# Patient Record
Sex: Female | Born: 1955 | Race: White | Hispanic: No | Smoking: Former smoker
Health system: Southern US, Community
[De-identification: ages and names within clinical notes are randomized; demographics above are authoritative.]

## PROBLEM LIST (undated history)

## (undated) DIAGNOSIS — C801 Malignant (primary) neoplasm, unspecified: Secondary | ICD-10-CM

## (undated) DIAGNOSIS — M1990 Unspecified osteoarthritis, unspecified site: Secondary | ICD-10-CM

## (undated) DIAGNOSIS — D490 Neoplasm of unspecified behavior of digestive system: Secondary | ICD-10-CM

## (undated) DIAGNOSIS — D039 Melanoma in situ, unspecified: Secondary | ICD-10-CM

## (undated) DIAGNOSIS — R002 Palpitations: Secondary | ICD-10-CM

## (undated) DIAGNOSIS — T7840XA Allergy, unspecified, initial encounter: Secondary | ICD-10-CM

## (undated) HISTORY — DX: Melanoma in situ, unspecified: D03.9

## (undated) HISTORY — PX: MELANOMA EXCISION: SHX5266

## (undated) HISTORY — DX: Malignant (primary) neoplasm, unspecified: C80.1

## (undated) HISTORY — PX: OTHER SURGICAL HISTORY: SHX169

## (undated) HISTORY — DX: Palpitations: R00.2

## (undated) HISTORY — DX: Neoplasm of unspecified behavior of digestive system: D49.0

## (undated) HISTORY — DX: Allergy, unspecified, initial encounter: T78.40XA

## (undated) HISTORY — PX: CARPAL TUNNEL RELEASE: SHX101

## (undated) HISTORY — DX: Unspecified osteoarthritis, unspecified site: M19.90

---

## 2001-04-01 ENCOUNTER — Other Ambulatory Visit: Admission: RE | Admit: 2001-04-01 | Discharge: 2001-04-01 | Payer: Self-pay | Admitting: Obstetrics and Gynecology

## 2002-04-01 ENCOUNTER — Ambulatory Visit (HOSPITAL_COMMUNITY): Admission: RE | Admit: 2002-04-01 | Discharge: 2002-04-01 | Payer: Self-pay | Admitting: Obstetrics & Gynecology

## 2002-04-01 ENCOUNTER — Encounter: Payer: Self-pay | Admitting: Obstetrics & Gynecology

## 2002-10-07 ENCOUNTER — Ambulatory Visit (HOSPITAL_COMMUNITY): Admission: RE | Admit: 2002-10-07 | Discharge: 2002-10-07 | Payer: Self-pay | Admitting: Obstetrics & Gynecology

## 2002-10-07 ENCOUNTER — Encounter: Payer: Self-pay | Admitting: Obstetrics & Gynecology

## 2002-10-09 DIAGNOSIS — D039 Melanoma in situ, unspecified: Secondary | ICD-10-CM

## 2002-10-09 HISTORY — DX: Melanoma in situ, unspecified: D03.9

## 2003-05-01 ENCOUNTER — Encounter: Payer: Self-pay | Admitting: Emergency Medicine

## 2003-05-01 ENCOUNTER — Emergency Department (HOSPITAL_COMMUNITY): Admission: EM | Admit: 2003-05-01 | Discharge: 2003-05-01 | Payer: Self-pay | Admitting: Emergency Medicine

## 2003-11-10 ENCOUNTER — Ambulatory Visit (HOSPITAL_COMMUNITY): Admission: RE | Admit: 2003-11-10 | Discharge: 2003-11-10 | Payer: Self-pay | Admitting: Obstetrics & Gynecology

## 2004-09-05 ENCOUNTER — Ambulatory Visit (HOSPITAL_COMMUNITY): Admission: RE | Admit: 2004-09-05 | Discharge: 2004-09-05 | Payer: Self-pay | Admitting: Family Medicine

## 2005-01-12 ENCOUNTER — Ambulatory Visit: Payer: Self-pay | Admitting: Orthopedic Surgery

## 2006-07-30 ENCOUNTER — Ambulatory Visit (HOSPITAL_COMMUNITY): Admission: RE | Admit: 2006-07-30 | Discharge: 2006-07-30 | Payer: Self-pay | Admitting: Obstetrics & Gynecology

## 2007-08-05 ENCOUNTER — Other Ambulatory Visit: Admission: RE | Admit: 2007-08-05 | Discharge: 2007-08-05 | Payer: Self-pay | Admitting: Obstetrics & Gynecology

## 2007-08-13 ENCOUNTER — Ambulatory Visit (HOSPITAL_COMMUNITY): Admission: RE | Admit: 2007-08-13 | Discharge: 2007-08-13 | Payer: Self-pay | Admitting: Obstetrics & Gynecology

## 2008-06-10 ENCOUNTER — Encounter: Payer: Self-pay | Admitting: Orthopedic Surgery

## 2008-06-10 ENCOUNTER — Ambulatory Visit (HOSPITAL_COMMUNITY): Admission: RE | Admit: 2008-06-10 | Discharge: 2008-06-10 | Payer: Self-pay | Admitting: Family Medicine

## 2008-08-10 ENCOUNTER — Ambulatory Visit: Payer: Self-pay | Admitting: Orthopedic Surgery

## 2008-08-10 DIAGNOSIS — M19079 Primary osteoarthritis, unspecified ankle and foot: Secondary | ICD-10-CM | POA: Insufficient documentation

## 2008-08-11 ENCOUNTER — Telehealth: Payer: Self-pay | Admitting: Orthopedic Surgery

## 2008-08-12 ENCOUNTER — Ambulatory Visit (HOSPITAL_COMMUNITY): Admission: RE | Admit: 2008-08-12 | Discharge: 2008-08-12 | Payer: Self-pay | Admitting: Orthopedic Surgery

## 2008-08-17 ENCOUNTER — Ambulatory Visit: Payer: Self-pay | Admitting: Orthopedic Surgery

## 2008-08-17 DIAGNOSIS — S93409A Sprain of unspecified ligament of unspecified ankle, initial encounter: Secondary | ICD-10-CM | POA: Insufficient documentation

## 2008-08-17 DIAGNOSIS — M7750 Other enthesopathy of unspecified foot: Secondary | ICD-10-CM | POA: Insufficient documentation

## 2008-08-18 ENCOUNTER — Encounter: Payer: Self-pay | Admitting: Orthopedic Surgery

## 2008-08-18 ENCOUNTER — Encounter (HOSPITAL_COMMUNITY): Admission: RE | Admit: 2008-08-18 | Discharge: 2008-09-17 | Payer: Self-pay | Admitting: Orthopedic Surgery

## 2008-08-28 ENCOUNTER — Ambulatory Visit (HOSPITAL_COMMUNITY): Admission: RE | Admit: 2008-08-28 | Discharge: 2008-08-28 | Payer: Self-pay | Admitting: Family Medicine

## 2008-09-16 ENCOUNTER — Ambulatory Visit: Payer: Self-pay | Admitting: Orthopedic Surgery

## 2008-09-21 ENCOUNTER — Ambulatory Visit: Payer: Self-pay | Admitting: Orthopedic Surgery

## 2008-09-21 DIAGNOSIS — IMO0002 Reserved for concepts with insufficient information to code with codable children: Secondary | ICD-10-CM | POA: Insufficient documentation

## 2008-09-22 ENCOUNTER — Encounter (HOSPITAL_COMMUNITY): Admission: RE | Admit: 2008-09-22 | Discharge: 2008-10-22 | Payer: Self-pay | Admitting: Orthopedic Surgery

## 2008-09-22 ENCOUNTER — Ambulatory Visit (HOSPITAL_COMMUNITY): Payer: Self-pay | Admitting: Orthopedic Surgery

## 2008-09-22 ENCOUNTER — Encounter: Payer: Self-pay | Admitting: Orthopedic Surgery

## 2008-09-22 ENCOUNTER — Encounter (HOSPITAL_COMMUNITY): Admission: RE | Admit: 2008-09-22 | Discharge: 2008-10-06 | Payer: Self-pay | Admitting: Orthopedic Surgery

## 2008-09-22 ENCOUNTER — Ambulatory Visit: Payer: Self-pay | Admitting: Orthopedic Surgery

## 2008-09-23 ENCOUNTER — Ambulatory Visit: Payer: Self-pay | Admitting: Orthopedic Surgery

## 2008-09-24 ENCOUNTER — Ambulatory Visit: Payer: Self-pay | Admitting: Orthopedic Surgery

## 2008-09-28 ENCOUNTER — Ambulatory Visit: Payer: Self-pay | Admitting: Orthopedic Surgery

## 2008-10-12 ENCOUNTER — Ambulatory Visit: Payer: Self-pay | Admitting: Orthopedic Surgery

## 2009-04-13 ENCOUNTER — Ambulatory Visit (HOSPITAL_COMMUNITY): Admission: RE | Admit: 2009-04-13 | Discharge: 2009-04-13 | Payer: Self-pay | Admitting: Obstetrics & Gynecology

## 2009-04-29 ENCOUNTER — Other Ambulatory Visit: Admission: RE | Admit: 2009-04-29 | Discharge: 2009-04-29 | Payer: Self-pay | Admitting: Obstetrics & Gynecology

## 2009-08-19 ENCOUNTER — Ambulatory Visit: Payer: Self-pay | Admitting: Orthopedic Surgery

## 2009-08-19 DIAGNOSIS — M25519 Pain in unspecified shoulder: Secondary | ICD-10-CM

## 2009-08-19 DIAGNOSIS — G5690 Unspecified mononeuropathy of unspecified upper limb: Secondary | ICD-10-CM | POA: Insufficient documentation

## 2010-07-12 ENCOUNTER — Emergency Department (HOSPITAL_COMMUNITY): Admission: EM | Admit: 2010-07-12 | Discharge: 2010-07-12 | Payer: Self-pay | Admitting: Emergency Medicine

## 2010-10-09 DIAGNOSIS — R002 Palpitations: Secondary | ICD-10-CM

## 2010-10-09 HISTORY — DX: Palpitations: R00.2

## 2011-04-11 ENCOUNTER — Ambulatory Visit (INDEPENDENT_AMBULATORY_CARE_PROVIDER_SITE_OTHER): Payer: BC Managed Care – PPO | Admitting: Orthopedic Surgery

## 2011-04-11 ENCOUNTER — Encounter: Payer: Self-pay | Admitting: Orthopedic Surgery

## 2011-04-11 VITALS — HR 70 | Ht 66.5 in | Wt 140.0 lb

## 2011-04-11 DIAGNOSIS — M19079 Primary osteoarthritis, unspecified ankle and foot: Secondary | ICD-10-CM | POA: Insufficient documentation

## 2011-04-11 NOTE — Patient Instructions (Signed)
Ankle arthritis  Aleve twice a day

## 2011-04-11 NOTE — Progress Notes (Signed)
55 year old female presents back with ankle pain  In 2009 we had an MRI which showed the following:  Tendinosis and intrasubstance partial tearing the peroneus  longus tendon. No full-thickness tear is identified.  2. Tenosynovitis of the tibialis posterior, flexor digitorum  longus and possibly flexor hallucis longus tendons. Fluid about  the FHL may be related to a tibiotalar joint effusion.  3. Marked tibiotalar osteoarthritis.  4. Small osteochondral lesion in the left lateral talar dome.  5. 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  6. Increased T2 signal about the PTFL as can be seen in posterior  impingement.   She comes in today with complaints of pain in her RIGHT ankle described as sharp and stabbing intermittent seems to be worse when she is trying to play tennis run or pivot and it is in the dorsal and lateral portion of the ankle joint.  She denies any new injury.  Pain started about 3 weeks ago.  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  Her review of systems is normal no shortness of breath heartburn frequency numbness or tingling  Vital signs are stable as recorded  General appearance is normal  The patient is alert and oriented x3  The patient's mood and affect are normal  Gait assessment: Seems to be normal with decreased dorsiflexion of the RIGHT ankle The cardiovascular exam reveals normal pulses and temperature without edema swelling.  The sensory exam is normal.  There are no pathologic reflexes.  Balance is normal.   Exam of the RIGHT ankle reveals 5 of dorsiflexion 35 plantar flexion Inspection Tenderness dorsally and at the lateral tib-fib area Range of motion Stated Stability Normal Strength  Normal Skin Normal  Repeat film showed tibial talar arthritis especially on the lateral view.  On the AP view there appears to be some shift in the talus in the mortise.  Diagnosis tibiotalar arthritis severe  Recommended change activities, take up a new sports such as golf.  Although the traditional treatment has been ankle fusion I would think that she might be a candidate for an ankle replacement in the future.  She should take some anti-inflammatories.  We decided to use Aleve twice a day since ibuprofen tends to not upper stomach.  If this doesn't work I will call her in a prescription.

## 2011-06-27 ENCOUNTER — Encounter: Payer: Self-pay | Admitting: Gastroenterology

## 2011-07-03 ENCOUNTER — Ambulatory Visit (AMBULATORY_SURGERY_CENTER): Payer: BC Managed Care – PPO | Admitting: *Deleted

## 2011-07-03 VITALS — Ht 66.0 in | Wt 145.2 lb

## 2011-07-03 DIAGNOSIS — Z1211 Encounter for screening for malignant neoplasm of colon: Secondary | ICD-10-CM

## 2011-07-03 MED ORDER — PEG-KCL-NACL-NASULF-NA ASC-C 100 G PO SOLR: 1.0000 | Freq: Once | ORAL | Status: DC

## 2011-07-04 ENCOUNTER — Encounter: Payer: Self-pay | Admitting: Gastroenterology

## 2011-07-12 ENCOUNTER — Other Ambulatory Visit: Payer: Self-pay | Admitting: Obstetrics & Gynecology

## 2011-07-12 DIAGNOSIS — Q826 Congenital sacral dimple: Secondary | ICD-10-CM

## 2011-07-12 DIAGNOSIS — Q899 Congenital malformation, unspecified: Secondary | ICD-10-CM

## 2011-07-17 ENCOUNTER — Ambulatory Visit (AMBULATORY_SURGERY_CENTER): Payer: BC Managed Care – PPO | Admitting: Gastroenterology

## 2011-07-17 ENCOUNTER — Encounter: Payer: Self-pay | Admitting: Gastroenterology

## 2011-07-17 VITALS — BP 130/84 | HR 55 | Temp 97.0°F | Resp 19 | Ht 66.0 in | Wt 145.0 lb

## 2011-07-17 DIAGNOSIS — Z1211 Encounter for screening for malignant neoplasm of colon: Secondary | ICD-10-CM

## 2011-07-17 MED ORDER — SODIUM CHLORIDE 0.9 % IV SOLN: 500.0000 mL | INTRAVENOUS | Status: DC

## 2011-07-17 NOTE — Progress Notes (Signed)
Pt was cramping while the scope was advanced to the cecum.  Once the scope was being removed, the pt relaxed ans rested comfortably.  Sleeping noted.  maw

## 2011-07-17 NOTE — Patient Instructions (Signed)
PLEASE FOLLOW DISCHARGE INSTRUCTIONS GIVEN TODAY. RESUME MEDICATIONS TODAY. NORMAL COLONOSCOPY, REPEAT THIS IN 10 YEARS. CALL us WITH ANY QUESTIONS OR CONCERNS.

## 2011-07-18 ENCOUNTER — Telehealth: Payer: Self-pay | Admitting: *Deleted

## 2011-07-18 NOTE — Telephone Encounter (Signed)
No answer, no identifier on answering machine, no message left.

## 2011-07-26 ENCOUNTER — Ambulatory Visit (HOSPITAL_COMMUNITY)
Admission: RE | Admit: 2011-07-26 | Discharge: 2011-07-26 | Disposition: A | Discharge status: Home / Self Care | Payer: BC Managed Care – PPO | Source: Ambulatory Visit | Attending: Obstetrics & Gynecology | Admitting: Obstetrics & Gynecology

## 2011-07-26 ENCOUNTER — Other Ambulatory Visit (HOSPITAL_COMMUNITY): Payer: Self-pay | Admitting: Obstetrics & Gynecology

## 2011-07-26 DIAGNOSIS — N6489 Other specified disorders of breast: Secondary | ICD-10-CM | POA: Insufficient documentation

## 2011-07-26 DIAGNOSIS — Q899 Congenital malformation, unspecified: Secondary | ICD-10-CM

## 2011-08-07 ENCOUNTER — Other Ambulatory Visit: Payer: Self-pay | Admitting: Obstetrics & Gynecology

## 2011-08-07 ENCOUNTER — Other Ambulatory Visit (HOSPITAL_COMMUNITY)
Admission: RE | Admit: 2011-08-07 | Discharge: 2011-08-07 | Disposition: A | Discharge status: Home / Self Care | Payer: BC Managed Care – PPO | Source: Ambulatory Visit | Attending: Obstetrics & Gynecology | Admitting: Obstetrics & Gynecology

## 2011-08-07 DIAGNOSIS — Z01419 Encounter for gynecological examination (general) (routine) without abnormal findings: Secondary | ICD-10-CM | POA: Insufficient documentation

## 2011-08-22 ENCOUNTER — Telehealth: Payer: Self-pay | Admitting: Obstetrics & Gynecology

## 2011-08-22 NOTE — Telephone Encounter (Signed)
Dx is palpitations and sob need to wear for 2 weeks

## 2011-08-22 NOTE — Telephone Encounter (Signed)
Dr Despina Hidden needs a event monitor ordered on patient

## 2011-08-23 ENCOUNTER — Encounter: Payer: Self-pay | Admitting: *Deleted

## 2011-09-11 DIAGNOSIS — R002 Palpitations: Secondary | ICD-10-CM

## 2011-09-22 ENCOUNTER — Other Ambulatory Visit (HOSPITAL_COMMUNITY): Payer: Self-pay | Admitting: Family Medicine

## 2011-09-22 DIAGNOSIS — R0989 Other specified symptoms and signs involving the circulatory and respiratory systems: Secondary | ICD-10-CM

## 2011-09-22 DIAGNOSIS — R002 Palpitations: Secondary | ICD-10-CM

## 2011-09-22 DIAGNOSIS — K219 Gastro-esophageal reflux disease without esophagitis: Secondary | ICD-10-CM

## 2011-09-25 ENCOUNTER — Encounter (HOSPITAL_COMMUNITY): Payer: Self-pay

## 2011-09-25 ENCOUNTER — Ambulatory Visit (HOSPITAL_COMMUNITY)
Admission: RE | Admit: 2011-09-25 | Discharge: 2011-09-25 | Disposition: A | Discharge status: Home / Self Care | Payer: BC Managed Care – PPO | Source: Ambulatory Visit | Attending: Family Medicine | Admitting: Family Medicine

## 2011-09-25 DIAGNOSIS — R0602 Shortness of breath: Secondary | ICD-10-CM | POA: Insufficient documentation

## 2011-09-25 DIAGNOSIS — R002 Palpitations: Secondary | ICD-10-CM

## 2011-09-25 DIAGNOSIS — K219 Gastro-esophageal reflux disease without esophagitis: Secondary | ICD-10-CM

## 2011-09-25 DIAGNOSIS — R0989 Other specified symptoms and signs involving the circulatory and respiratory systems: Secondary | ICD-10-CM

## 2011-09-25 DIAGNOSIS — R072 Precordial pain: Secondary | ICD-10-CM | POA: Insufficient documentation

## 2011-09-25 MED ORDER — IOHEXOL 350 MG/ML SOLN
100.0000 mL | Freq: Once | INTRAVENOUS | Status: AC | PRN
  Administered 2011-09-25: 100 mL via INTRAVENOUS

## 2011-09-28 ENCOUNTER — Other Ambulatory Visit (HOSPITAL_COMMUNITY): Payer: Self-pay | Admitting: Family Medicine

## 2011-09-28 DIAGNOSIS — K769 Liver disease, unspecified: Secondary | ICD-10-CM

## 2011-09-28 DIAGNOSIS — R935 Abnormal findings on diagnostic imaging of other abdominal regions, including retroperitoneum: Secondary | ICD-10-CM

## 2011-09-29 ENCOUNTER — Ambulatory Visit (HOSPITAL_COMMUNITY)
Admission: RE | Admit: 2011-09-29 | Discharge: 2011-09-29 | Disposition: A | Discharge status: Home / Self Care | Payer: BC Managed Care – PPO | Source: Ambulatory Visit | Attending: Family Medicine | Admitting: Family Medicine

## 2011-09-29 DIAGNOSIS — K769 Liver disease, unspecified: Secondary | ICD-10-CM

## 2011-09-29 DIAGNOSIS — R935 Abnormal findings on diagnostic imaging of other abdominal regions, including retroperitoneum: Secondary | ICD-10-CM

## 2011-09-29 DIAGNOSIS — K7689 Other specified diseases of liver: Secondary | ICD-10-CM | POA: Insufficient documentation

## 2011-09-29 MED ORDER — GADOBENATE DIMEGLUMINE 529 MG/ML IV SOLN
13.0000 mL | Freq: Once | INTRAVENOUS | Status: AC | PRN
  Administered 2011-09-29: 13 mL via INTRAVENOUS

## 2011-11-02 ENCOUNTER — Other Ambulatory Visit: Payer: Self-pay | Admitting: Cardiology

## 2011-11-02 ENCOUNTER — Other Ambulatory Visit: Payer: Self-pay | Admitting: *Deleted

## 2011-11-02 DIAGNOSIS — R002 Palpitations: Secondary | ICD-10-CM

## 2011-11-13 ENCOUNTER — Encounter: Payer: Self-pay | Admitting: Cardiology

## 2011-11-14 ENCOUNTER — Encounter: Payer: Self-pay | Admitting: Cardiology

## 2011-11-14 ENCOUNTER — Ambulatory Visit (INDEPENDENT_AMBULATORY_CARE_PROVIDER_SITE_OTHER): Payer: BC Managed Care – PPO | Admitting: Cardiology

## 2011-11-14 DIAGNOSIS — R002 Palpitations: Secondary | ICD-10-CM

## 2011-11-14 DIAGNOSIS — D039 Melanoma in situ, unspecified: Secondary | ICD-10-CM

## 2011-11-14 DIAGNOSIS — C439 Malignant melanoma of skin, unspecified: Secondary | ICD-10-CM

## 2011-11-14 DIAGNOSIS — M1990 Unspecified osteoarthritis, unspecified site: Secondary | ICD-10-CM | POA: Insufficient documentation

## 2011-11-14 NOTE — Assessment & Plan Note (Addendum)
There is no evidence by exam, EKG or symptoms for any significant underlying cardiac disease.  Event recorder shows no important arrhythmias.  Symptoms have subsided since she moderated intake of caffeine.  Under the circumstances, no further evaluation or treatment is necessary.  Sue Gutierrez will return to see me should symptoms recur.  I will not plan a routine return visit, but will be happy to reassess this nice woman at any time Dr. Regino Schultze or Dr. Despina Hidden requests.

## 2011-11-14 NOTE — Patient Instructions (Signed)
Your physician recommends that you schedule a follow-up appointment in: As needed  Call for further palpitations

## 2011-11-14 NOTE — Progress Notes (Signed)
Patient ID: Sue Gutierrez, female   DOB: 04/15/1956, 56 y.o.   MRN: [id number] seen for an initial visit at the kind request of Dr. Turner Daniels for evaluation of palpitations.  This nice woman has enjoyed generally excellent health.  She has had no cardiac disease, has never been seen by a cardiologist nor has she undergone any significant cardiac testing.  She is active, including playing tennis, without any cardiopulmonary symptoms during exertion.  Over the past 2 months, she has noted palpitations.  Her initial spell included very forceful heartbeats, perhaps in rapid succession for just a few beats.  Subsequently, she has noted brief fluttering.  An event recorder showed PVCs that were not entirely correlated with symptoms.  TSH was normal.  No current medications.   Allergies  Allergen Reactions  . Erythromycin Rash  . Penicillins Rash  . Septra (Bactrim) Rash  . Sulfamethoxazole W/Trimethoprim Rash    Past Medical History  Diagnosis Date  . 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 Melanoma     Left leg  . Palpitations     Event recorder in 09/2011-multiple spells with palpitations and dyspnea, mostly with sinus rhythm but some with PACs or PVCs; normal TSH of 3.1 in 07/2011  . Salivary gland tumor    Past Surgical History  Procedure Date  . Salivary gland tumor   . Melanoma excision   . Carpal tunnel release     Family History  Problem Relation Age of Onset  . Colon cancer Neg Hx   . Esophageal cancer Neg Hx   . Stomach cancer Neg Hx   . Stroke Father     History   Social History  . Marital Status: Married    Spouse Name: N/A    Number of Children: 2  . Years of Education: 16 yrs   Occupational History  . Special Education Teacher    Social History Main Topics  . Smoking status: Former Games developer  . Smokeless tobacco: Never Used  . Alcohol Use: 0.5 oz/week    1 drink(s) per week     1 drink per week  . Drug Use: Not on file  . Sexually  Active: Not on file   Other Topics Concern  . Not on file   Social History Narrative  . No narrative on file  ROS:  Requires corrective lenses and utilizes contact; stable weight and appetite; postmenopausal.   All other systems reviewed and are negative.  PHYSICAL EXAM: BP 117/70  Pulse 66  Resp 16  Ht 5\' 6"  (1.676 m)  Wt 64.864 kg (143 lb)  BMI 23.08 kg/m2  General-Well-developed; no acute distress Body Habitus-proportionate weight and height HEENT-[place]/AT; PERRL; EOM intact; conjunctiva and lids nl Neck-No JVD; no carotid bruits Endocrine-Mild diffuse thyromegaly Lungs-Clear lung fields; resonant percussion; normal I-to-E ratio Cardiovascular- normal PMI; normal S1 and S2; minimal systolic ejection murmur; no prematures Abdomen-BS normal; soft and non-tender without masses or organomegaly Musculoskeletal-No deformities, cyanosis or clubbing Neurologic-Nl cranial nerves; symmetric strength and tone Skin- Warm, no significant lesions Extremities-Nl distal pulses; no edema  EKG:  Normal sinus rhythm; within normal limits.  No previous tracing for comparison.   ASSESSMENT AND PLAN:  Sue Bing, MD 11/14/2011 11:54 AM

## 2011-11-23 ENCOUNTER — Encounter: Payer: Self-pay | Admitting: Cardiology

## 2011-11-23 DIAGNOSIS — Z0189 Encounter for other specified special examinations: Secondary | ICD-10-CM | POA: Insufficient documentation

## 2012-08-19 ENCOUNTER — Other Ambulatory Visit: Payer: Self-pay | Admitting: Obstetrics & Gynecology

## 2012-08-19 DIAGNOSIS — Z09 Encounter for follow-up examination after completed treatment for conditions other than malignant neoplasm: Secondary | ICD-10-CM

## 2012-08-28 ENCOUNTER — Ambulatory Visit (HOSPITAL_COMMUNITY)
Admission: RE | Admit: 2012-08-28 | Discharge: 2012-08-28 | Disposition: A | Discharge status: Home / Self Care | Payer: BC Managed Care – PPO | Source: Ambulatory Visit | Attending: Obstetrics & Gynecology | Admitting: Obstetrics & Gynecology

## 2012-08-28 ENCOUNTER — Other Ambulatory Visit: Payer: Self-pay | Admitting: Obstetrics & Gynecology

## 2012-08-28 DIAGNOSIS — Z09 Encounter for follow-up examination after completed treatment for conditions other than malignant neoplasm: Secondary | ICD-10-CM

## 2012-09-04 ENCOUNTER — Ambulatory Visit (HOSPITAL_COMMUNITY)
Admission: RE | Admit: 2012-09-04 | Discharge: 2012-09-04 | Disposition: A | Discharge status: Home / Self Care | Payer: BC Managed Care – PPO | Source: Ambulatory Visit | Attending: Obstetrics & Gynecology | Admitting: Obstetrics & Gynecology

## 2012-09-04 ENCOUNTER — Encounter (HOSPITAL_COMMUNITY): Payer: BC Managed Care – PPO

## 2012-09-04 DIAGNOSIS — Z09 Encounter for follow-up examination after completed treatment for conditions other than malignant neoplasm: Secondary | ICD-10-CM

## 2013-11-03 IMAGING — MG MM DIGITAL DIAGNOSTIC BILAT
4 series · 4 of 4 positions shown · non-contrast
Comparison: 07/26/2011, 04/13/2009

CLINICAL DATA: The patient states that she has no new problems and
feels no mass in either breast. The dimple in the inferior portion
the left breast is unchanged.

DIGITAL DIAGNOSTIC BILATERAL MAMMOGRAM WITH CAD

[L CC]
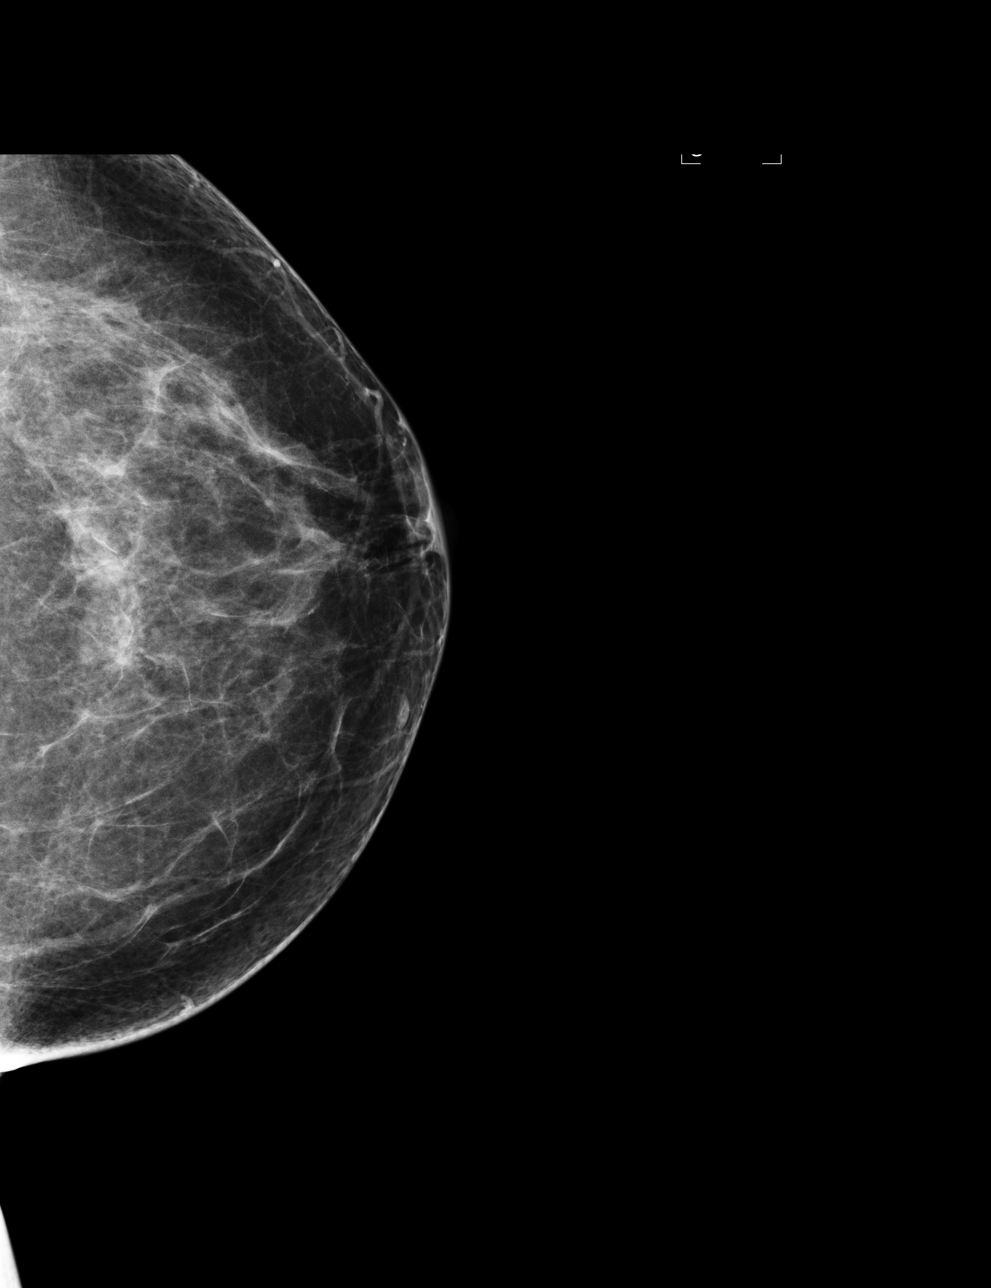

[L MLO]
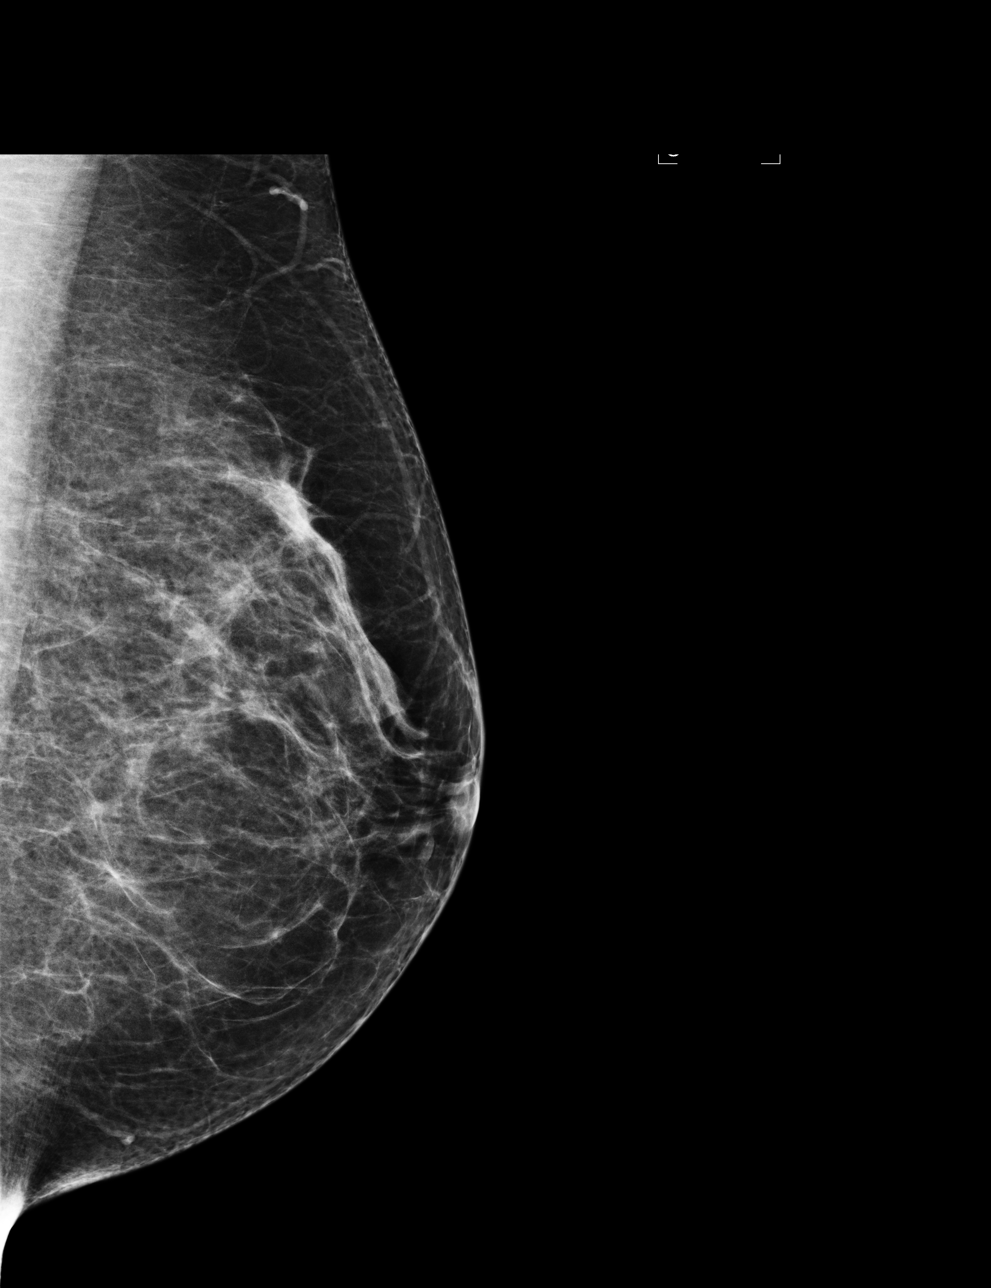

[R CC]
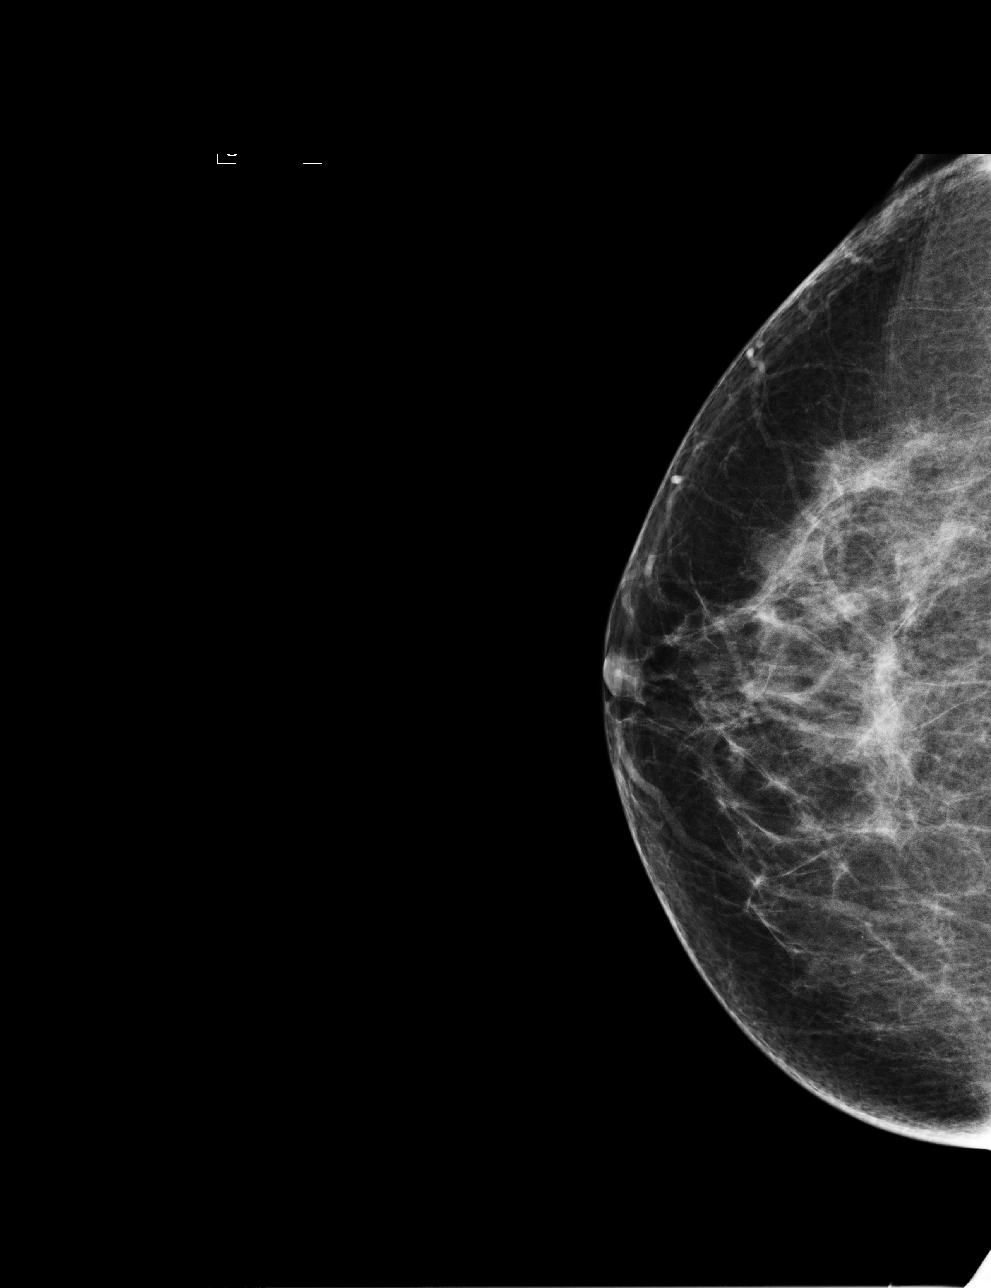

[R MLO]
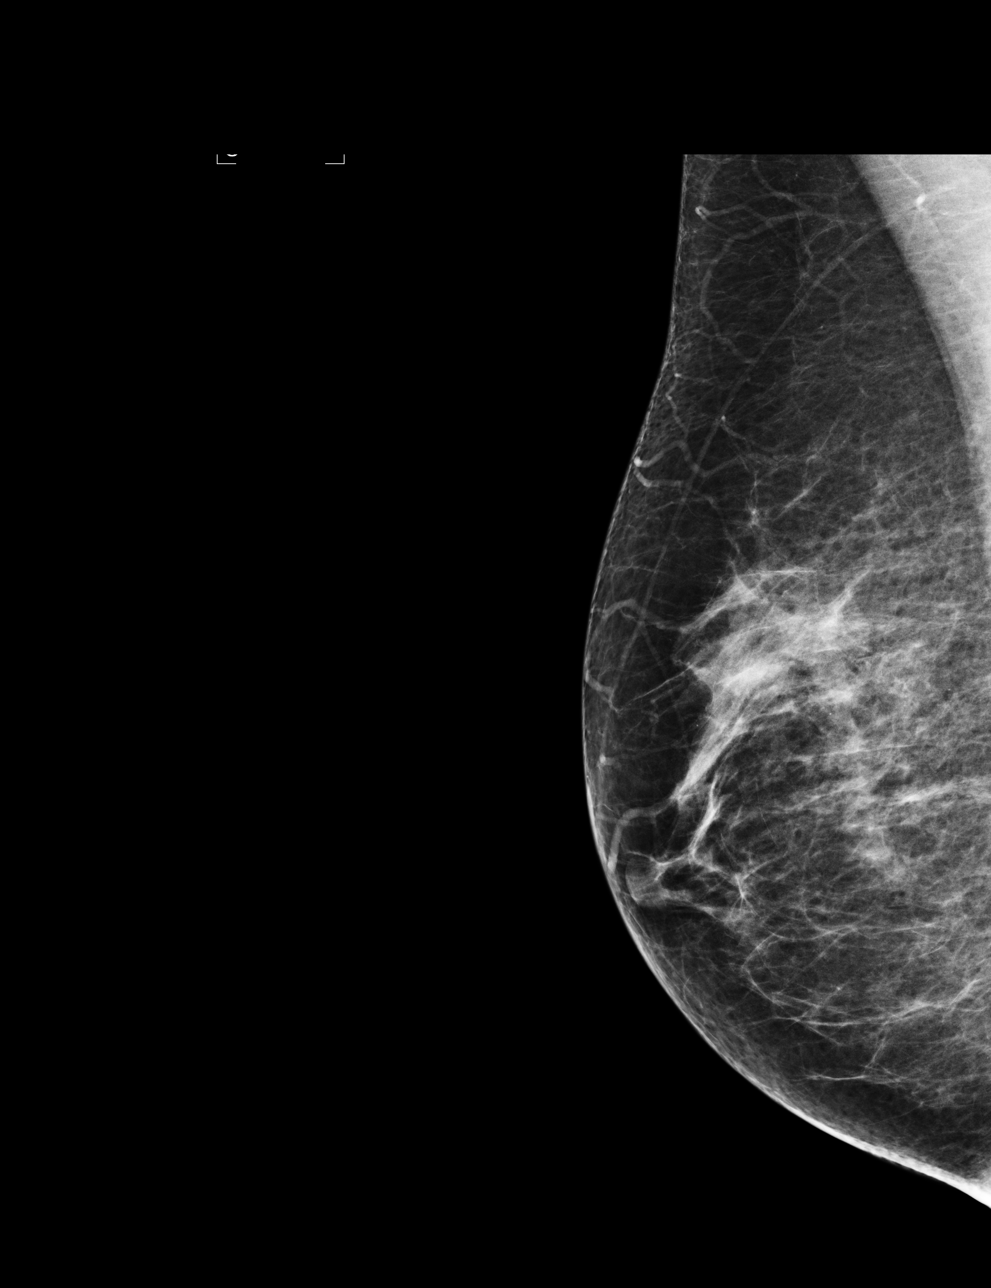

[4 of 4 positions shown; findings below may reference images not displayed]

FINDINGS: There  are scattered fibroglandular densities.  There is
no suspicious dominant mass, architectural distortion or
calcification to suggest malignancy.
Mammographic images were processed with CAD.
IMPRESSION: No mammographic evidence of malignancy.

RECOMMENDATION:
Yearly screening mammography is suggested.

I have discussed the findings and recommendations with the patient.
Results were also provided in writing at the conclusion of the
visit.

BI-RADS CATEGORY 1:  Negative.

## 2014-01-26 ENCOUNTER — Other Ambulatory Visit: Payer: Self-pay | Admitting: Obstetrics & Gynecology

## 2014-01-26 DIAGNOSIS — Z1231 Encounter for screening mammogram for malignant neoplasm of breast: Secondary | ICD-10-CM

## 2014-02-02 ENCOUNTER — Ambulatory Visit (HOSPITAL_COMMUNITY): Payer: BC Managed Care – PPO

## 2014-02-05 ENCOUNTER — Ambulatory Visit (INDEPENDENT_AMBULATORY_CARE_PROVIDER_SITE_OTHER): Payer: BC Managed Care – PPO | Admitting: Obstetrics & Gynecology

## 2014-02-05 ENCOUNTER — Encounter: Payer: Self-pay | Admitting: Obstetrics & Gynecology

## 2014-02-05 ENCOUNTER — Other Ambulatory Visit (HOSPITAL_COMMUNITY)
Admission: RE | Admit: 2014-02-05 | Discharge: 2014-02-05 | Disposition: A | Discharge status: Home / Self Care | Payer: BC Managed Care – PPO | Source: Ambulatory Visit | Attending: Obstetrics & Gynecology | Admitting: Obstetrics & Gynecology

## 2014-02-05 VITALS — BP 110/60 | Ht 66.0 in | Wt 137.4 lb

## 2014-02-05 DIAGNOSIS — Z01419 Encounter for gynecological examination (general) (routine) without abnormal findings: Secondary | ICD-10-CM

## 2014-02-05 DIAGNOSIS — Z1212 Encounter for screening for malignant neoplasm of rectum: Secondary | ICD-10-CM

## 2014-02-05 DIAGNOSIS — Z1151 Encounter for screening for human papillomavirus (HPV): Secondary | ICD-10-CM | POA: Insufficient documentation

## 2014-02-05 DIAGNOSIS — M8580 Other specified disorders of bone density and structure, unspecified site: Secondary | ICD-10-CM

## 2014-02-05 MED ORDER — OSPEMIFENE 60 MG PO TABS: 1.0000 | ORAL_TABLET | Freq: Every day | ORAL | Status: DC

## 2014-02-05 NOTE — Progress Notes (Signed)
Patient ID: Sue Gutierrez, female   DOB: 05/20/1956, 58 y.o.   MRN: [id number] Subjective:     Sue Gutierrez is a 58 y.o. female here for a routine exam.  No LMP recorded. Patient is postmenopausal. No obstetric history on file. Birth Control Method:  na Menstrual Calendar(currently): na  Current complaints: vaginal dryness.   Current acute medical issues:  none   Recent Gynecologic History No LMP recorded. Patient is postmenopausal. Last Pap: 2013,  normal Last mammogram: 2014,  normal  Past Medical History  Diagnosis Date  . 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 Melanoma in situ 2004    Left leg  . Palpitations 2012    Event recorder in 09/2011-multiple spells with palpitations and dyspnea, mostly with sinus rhythm but some with PACs or PVCs; normal TSH of 3.1 in 07/2011  . Salivary gland tumor   . Allergy     Past Surgical History  Procedure Laterality Date  . Salivary gland tumor    . Melanoma excision    . Carpal tunnel release      OB History   Grav Para Term Preterm Abortions TAB SAB Ect Mult Living                  History   Social History  . Marital Status: Married    Spouse Name: N/A    Number of Children: 2  . Years of Education: 16 yrs   Occupational History  . Special Education Teacher    Social History Main Topics  . Smoking status: Former Research scientist (life sciences)  . Smokeless tobacco: Never Used  . Alcohol Use: 0.5 oz/week    1 drink(s) per week     Comment: 1 drink per week  . Drug Use: None  . Sexual Activity: None   Other Topics Concern  . None   Social History Narrative  . None    Family History  Problem Relation Age of Onset  . Colon cancer Neg Hx   . Esophageal cancer Neg Hx   . Stomach cancer Neg Hx   . Stroke Father      Review of Systems  Review of Systems  Constitutional: Negative for fever, chills, weight loss, malaise/fatigue and diaphoresis.  HENT: Negative for hearing loss, ear pain, nosebleeds,  congestion, sore throat, neck pain, tinnitus and ear discharge.   Eyes: Negative for blurred vision, double vision, photophobia, pain, discharge and redness.  Respiratory: Negative for cough, hemoptysis, sputum production, shortness of breath, wheezing and stridor.   Cardiovascular: Negative for chest pain, palpitations, orthopnea, claudication, leg swelling and PND.  Gastrointestinal: negative for abdominal pain. Negative for heartburn, nausea, vomiting, diarrhea, constipation, blood in stool and melena.  Genitourinary: Negative for dysuria, urgency, frequency, hematuria and flank pain.  Musculoskeletal: Negative for myalgias, back pain, joint pain and falls.  Skin: Negative for itching and rash.  Neurological: Negative for dizziness, tingling, tremors, sensory change, speech change, focal weakness, seizures, loss of consciousness, weakness and headaches.  Endo/Heme/Allergies: Negative for environmental allergies and polydipsia. Does not bruise/bleed easily.  Psychiatric/Behavioral: Negative for depression, suicidal ideas, hallucinations, memory loss and substance abuse. The patient is not nervous/anxious and does not have insomnia.        Objective:    Physical Exam  Vitals reviewed. Constitutional: She is oriented to person, place, and time. She appears well-developed and well-nourished.  HENT:  Head: Normocephalic and atraumatic.        Right Ear: External ear normal.  Left Ear: External ear normal.  Nose: Nose normal.  Mouth/Throat: Oropharynx is clear and moist.  Eyes: Conjunctivae and EOM are normal. Pupils are equal, round, and reactive to light. Right eye exhibits no discharge. Left eye exhibits no discharge. No scleral icterus.  Neck: Normal range of motion. Neck supple. No tracheal deviation present. No thyromegaly present.  Cardiovascular: Normal rate, regular rhythm, normal heart sounds and intact distal pulses.  Exam reveals no gallop and no friction rub.   No murmur  heard. Respiratory: Effort normal and breath sounds normal. No respiratory distress. She has no wheezes. She has no rales. She exhibits no tenderness.  GI: Soft. Bowel sounds are normal. She exhibits no distension and no mass. There is no tenderness. There is no rebound and no guarding.  Genitourinary:  Breasts no masses skin changes or nipple changes bilaterally      Vulva is normal without lesions Vagina is pink moist without discharge Cervix normal in appearance and pap is done Uterus is normal size shape and contour Adnexa is negative with normal sized ovaries  Rectal    hemoccult negative, normal tone, no masses  Musculoskeletal: Normal range of motion. She exhibits no edema and no tenderness.  Neurological: She is alert and oriented to person, place, and time. She has normal reflexes. She displays normal reflexes. No cranial nerve deficit. She exhibits normal muscle tone. Coordination normal.  Skin: Skin is warm and dry. No rash noted. No erythema. No pallor.  Psychiatric: She has a normal mood and affect. Her behavior is normal. Judgment and thought content normal.       Assessment:    Healthy female exam.    Plan:    Mammogram ordered. Follow up in: 1 year. osphena

## 2014-02-09 ENCOUNTER — Ambulatory Visit (HOSPITAL_COMMUNITY)
Admission: RE | Admit: 2014-02-09 | Discharge: 2014-02-09 | Disposition: A | Discharge status: Home / Self Care | Payer: BC Managed Care – PPO | Source: Ambulatory Visit | Attending: Obstetrics & Gynecology | Admitting: Obstetrics & Gynecology

## 2014-02-09 DIAGNOSIS — Z780 Asymptomatic menopausal state: Secondary | ICD-10-CM | POA: Insufficient documentation

## 2014-02-09 DIAGNOSIS — Z1231 Encounter for screening mammogram for malignant neoplasm of breast: Secondary | ICD-10-CM

## 2014-02-09 DIAGNOSIS — M8580 Other specified disorders of bone density and structure, unspecified site: Secondary | ICD-10-CM

## 2014-02-09 DIAGNOSIS — M899 Disorder of bone, unspecified: Secondary | ICD-10-CM | POA: Insufficient documentation

## 2014-02-09 DIAGNOSIS — M949 Disorder of cartilage, unspecified: Secondary | ICD-10-CM

## 2014-02-17 ENCOUNTER — Ambulatory Visit (INDEPENDENT_AMBULATORY_CARE_PROVIDER_SITE_OTHER): Payer: BC Managed Care – PPO

## 2014-02-17 ENCOUNTER — Ambulatory Visit (INDEPENDENT_AMBULATORY_CARE_PROVIDER_SITE_OTHER): Payer: BC Managed Care – PPO | Admitting: Orthopedic Surgery

## 2014-02-17 VITALS — BP 100/79 | Ht 66.5 in | Wt 140.0 lb

## 2014-02-17 DIAGNOSIS — M79644 Pain in right finger(s): Secondary | ICD-10-CM

## 2014-02-17 DIAGNOSIS — M65319 Trigger thumb, unspecified thumb: Secondary | ICD-10-CM

## 2014-02-17 DIAGNOSIS — M79609 Pain in unspecified limb: Secondary | ICD-10-CM

## 2014-02-17 DIAGNOSIS — M6530 Trigger finger, unspecified finger: Secondary | ICD-10-CM

## 2014-02-17 NOTE — Patient Instructions (Signed)
Vitamin b6 100 mg twice a day for 6 weeks

## 2014-02-18 DIAGNOSIS — M65319 Trigger thumb, unspecified thumb: Secondary | ICD-10-CM | POA: Insufficient documentation

## 2014-02-18 NOTE — Progress Notes (Signed)
Patient ID: Sue Gutierrez, female   DOB: 24-Nov-1955, 58 y.o.   MRN: [id number]  Chief Complaint  Patient presents with  . Hand Pain    Right thumb and index finger pain    HISTORY: 58 year old female presents with pain in her right thumb with a history of previous carpal tunnel syndrome status post left carpal tunnel release without good result continues to complain of pain stiffness swelling especially in the mornings catching locking of the right thumb pain over the A1 pulley of the right thumb. 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 IP joint stable and nontender over de Quervain's area and nontender at the base of the thumb. Pinch strength normal Skin clean dry and intact Cardiovascular exam is normal Sensory exam normal  Encounter Diagnoses  Name Primary?  . Pain in finger of right hand   . Trigger thumb Yes    We discussed injection but she is a tennis tournament tonight and we will do the injection on Thursday

## 2014-02-23 ENCOUNTER — Ambulatory Visit (INDEPENDENT_AMBULATORY_CARE_PROVIDER_SITE_OTHER): Payer: BC Managed Care – PPO | Admitting: Orthopedic Surgery

## 2014-02-23 VITALS — Ht 66.5 in | Wt 140.0 lb

## 2014-02-23 DIAGNOSIS — M6530 Trigger finger, unspecified finger: Secondary | ICD-10-CM

## 2014-02-23 DIAGNOSIS — M65319 Trigger thumb, unspecified thumb: Secondary | ICD-10-CM

## 2014-02-23 NOTE — Patient Instructions (Signed)
You have received a steroid shot. 15% of patients experience increased pain at the injection site with in the next 24 hours. This is best treated with ice and tylenol extra strength 2 tabs every 8 hours. If you are still having pain please call the office.    

## 2014-02-23 NOTE — Progress Notes (Signed)
Patient ID: Sue Gutierrez, female   DOB: 25-Jan-1956, 58 y.o.   MRN: [id number] Trigger finger/right thumb injection  Procedure note trigger thumb injection  Diagnosis trigger finger Postop diagnosis trigger finger Procedure injection of trigger finger Finger injected RIGHT thumb Details of procedure: After verbal consent and timeout to confirm site the RIGHT thumb was injected with 1/2 cc of 40 mg of Depo-Medrol and 1 cc of 1% lidocaine  The procedure was tolerated well without complication

## 2014-02-26 ENCOUNTER — Encounter: Payer: Self-pay | Admitting: Obstetrics & Gynecology

## 2014-03-17 ENCOUNTER — Ambulatory Visit (INDEPENDENT_AMBULATORY_CARE_PROVIDER_SITE_OTHER): Payer: BC Managed Care – PPO | Admitting: Orthopedic Surgery

## 2014-03-17 ENCOUNTER — Ambulatory Visit (INDEPENDENT_AMBULATORY_CARE_PROVIDER_SITE_OTHER): Payer: BC Managed Care – PPO

## 2014-03-17 VITALS — BP 124/53 | Ht 66.5 in | Wt 140.0 lb

## 2014-03-17 DIAGNOSIS — M542 Cervicalgia: Secondary | ICD-10-CM

## 2014-03-17 DIAGNOSIS — M25519 Pain in unspecified shoulder: Secondary | ICD-10-CM

## 2014-03-17 DIAGNOSIS — M25512 Pain in left shoulder: Secondary | ICD-10-CM | POA: Insufficient documentation

## 2014-03-17 MED ORDER — CYCLOBENZAPRINE HCL 10 MG PO TABS: 10.0000 mg | ORAL_TABLET | Freq: Every day | ORAL | Status: DC

## 2014-03-17 NOTE — Progress Notes (Signed)
Patient ID: Sue Gutierrez, female   DOB: [birth year], 58 y.o.   MRN: [id number]  Chief Complaint  Patient presents with  . Shoulder Pain    Left shoulder pain, no injury.    HISTORY:\ 59 years old 3-4 years of pain and numbness and tingling around the upper scapular border of the left upper extremity. Pain stiffness tingling burning radiates into the neck. Pain is 5/10 she did try some Tylenol. She feels it along with some tightness in the cervical spine  System review is negative except for contacts.  New problem established patient history recorded previously as follows  Past Medical History  Diagnosis Date  . 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 Melanoma in situ 2004    Left leg  . Palpitations 2012    Event recorder in 09/2011-multiple spells with palpitations and dyspnea, mostly with sinus rhythm but some with PACs or PVCs; normal TSH of 3.1 in 07/2011  . Salivary gland tumor   . Allergy    Past Surgical History  Procedure Laterality Date  . Salivary gland tumor    . Melanoma excision    . Carpal tunnel release     Vital signs: BP 124/53  Ht 5' 6.5" (1.689 m)  Wt 140 lb (63.504 kg)  BMI 22.26 kg/m2   General the patient is well-developed and well-nourished grooming and hygiene are normal Oriented x3 Mood and affect normal Ambulation normal Inspection of the cervical spine and left shoulder  She has mild tenderness at the C5-C6 junction. Is tenderness at the superior and was On left side. She has increased muscle tension left paracervical musculature. She is mild tenderness in both traps. Is normal upper extremity motor exam skin no lymph nodes normal pulses and normal sensation.  Cervical range of motion normal with flexion, discomfort with extension slight decreased lateral flexion  Rotation normal  Ordered an x-ray today I read this as cervical spondylosis mild  Encounter Diagnoses  Name Primary?  . Neck pain Yes  . Trigger point of left  shoulder region     Procedure report   Preoperative diagnosis trigger point left shoulder  Postop diagnosis same Anesthetic ethyl chloride Prepped with alcohol One-to-one mixture of Depo-Medrol 40 mg per cc and lidocaine 1% injected point of maximal tenderness superior angle of the scapula

## 2014-03-17 NOTE — Patient Instructions (Signed)

## 2014-04-28 ENCOUNTER — Ambulatory Visit (INDEPENDENT_AMBULATORY_CARE_PROVIDER_SITE_OTHER): Payer: BC Managed Care – PPO | Admitting: Orthopedic Surgery

## 2014-04-28 VITALS — BP 119/84 | Ht 66.5 in | Wt 140.0 lb

## 2014-04-28 DIAGNOSIS — M25519 Pain in unspecified shoulder: Secondary | ICD-10-CM | POA: Insufficient documentation

## 2014-04-28 DIAGNOSIS — M25511 Pain in right shoulder: Secondary | ICD-10-CM

## 2014-04-28 NOTE — Patient Instructions (Signed)
Follow up in 1 month after massage

## 2014-04-30 ENCOUNTER — Encounter: Payer: Self-pay | Admitting: Orthopedic Surgery

## 2014-04-30 NOTE — Progress Notes (Signed)
Patient ID: Sue Gutierrez, female   DOB: [birth year], 58 y.o.   MRN: [id number] Chief Complaint  Patient presents with  . Shoulder Pain    right shoulder pain, no known injury    58 year old female presents with several knots in her upper shoulders and cervical spine and think she may have another trigger point on the right side she's had this for over 25 years. She has some cervical spine alignment abnormalities on last x-rays presents with sharp dull throbbing burning stabbing aching radiating pain into the right arm. Patient MRI years ago findings are unknown. She does report Tylenol helps with her pain although does radiate up into the neck and head area associated with headaches and exercises that involve rolling motion as well as tenderness. She is issues it in further treatment for trigger point.  Past Medical History  Diagnosis Date  . 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 Melanoma in situ 2004    Left leg  . Palpitations 2012    Event recorder in 09/2011-multiple spells with palpitations and dyspnea, mostly with sinus rhythm but some with PACs or PVCs; normal TSH of 3.1 in 07/2011  . Salivary gland tumor   . Allergy    Past Surgical History  Procedure Laterality Date  . Salivary gland tumor    . Melanoma excision    . Carpal tunnel release     Family History  Problem Relation Age of Onset  . Colon cancer Neg Hx   . Esophageal cancer Neg Hx   . Stomach cancer Neg Hx   . Stroke Father    Vital signs: BP 119/84  Ht 5' 6.5" (1.689 m)  Wt 140 lb (63.504 kg)  BMI 22.26 kg/m2   General the patient is well-developed and well-nourished grooming and hygiene are normal Oriented x3 Mood and affect normal Ambulation normal Inspection of the cervical spine shows that there is some painful range of motion in pain with the Spurling maneuver. The shoulder has full range of motion and is stable is no motor weakness. Skin clean dry and intact. No rashes or pigmented  lesions  Cardiovascular exam is normal Sensory exam normal  Superior border of the scapula tenderness and reproduces radicular type symptoms.  Trigger point injection right shoulder. Verbal consent. Timeout. 40 mg of Depo-Medrol and 3 cc lidocaine was injected at the point of maximal tenderness without complication. Use skin prep with alcohol and ethyl chloride for anesthesia.

## 2014-06-02 ENCOUNTER — Ambulatory Visit: Payer: BC Managed Care – PPO | Admitting: Orthopedic Surgery

## 2015-02-27 ENCOUNTER — Other Ambulatory Visit: Payer: Self-pay | Admitting: Obstetrics & Gynecology

## 2015-04-05 ENCOUNTER — Ambulatory Visit (INDEPENDENT_AMBULATORY_CARE_PROVIDER_SITE_OTHER): Payer: BC Managed Care – PPO | Admitting: Orthopedic Surgery

## 2015-04-05 ENCOUNTER — Encounter: Payer: Self-pay | Admitting: Orthopedic Surgery

## 2015-04-05 VITALS — BP 140/84 | Ht 66.5 in | Wt 135.0 lb

## 2015-04-05 DIAGNOSIS — M65311 Trigger thumb, right thumb: Secondary | ICD-10-CM

## 2015-04-05 NOTE — Progress Notes (Signed)
Patient ID: Sue Gutierrez, female   DOB: 1955/10/16, 59 y.o.   MRN: [id number] Chief Complaint  Patient presents with  . Follow-up    Recheck on right trigger thumb, needs injection.    Right Trigger thumb injection Medication  1 mL of 40 mg Depo-Medrol  2 mL of 1% lidocaine plain  Ethyl chloride for anesthesia  Verbal consent was obtained timeout was taken to confirm the injection site as right thumb  Alcohol was used to prepare the skin along with ethyl chloride and then the injection was made at the A1 pulley there were no complications

## 2015-07-06 ENCOUNTER — Other Ambulatory Visit: Payer: Self-pay | Admitting: Obstetrics & Gynecology

## 2015-07-06 DIAGNOSIS — Z1231 Encounter for screening mammogram for malignant neoplasm of breast: Secondary | ICD-10-CM

## 2015-07-07 ENCOUNTER — Encounter: Payer: Self-pay | Admitting: Obstetrics & Gynecology

## 2015-07-07 ENCOUNTER — Other Ambulatory Visit (HOSPITAL_COMMUNITY)
Admission: RE | Admit: 2015-07-07 | Discharge: 2015-07-07 | Disposition: A | Discharge status: Home / Self Care | Payer: BC Managed Care – PPO | Source: Ambulatory Visit | Attending: Obstetrics & Gynecology | Admitting: Obstetrics & Gynecology

## 2015-07-07 ENCOUNTER — Ambulatory Visit (INDEPENDENT_AMBULATORY_CARE_PROVIDER_SITE_OTHER): Payer: BC Managed Care – PPO | Admitting: Obstetrics & Gynecology

## 2015-07-07 VITALS — BP 130/80 | HR 60 | Ht 66.0 in | Wt 134.4 lb

## 2015-07-07 DIAGNOSIS — Z01419 Encounter for gynecological examination (general) (routine) without abnormal findings: Secondary | ICD-10-CM

## 2015-07-07 DIAGNOSIS — Z1211 Encounter for screening for malignant neoplasm of colon: Secondary | ICD-10-CM

## 2015-07-07 DIAGNOSIS — Z1212 Encounter for screening for malignant neoplasm of rectum: Secondary | ICD-10-CM

## 2015-07-07 MED ORDER — ESTROGENS, CONJUGATED 0.625 MG/GM VA CREA: 1.0000 | TOPICAL_CREAM | Freq: Every day | VAGINAL | Status: DC

## 2015-07-07 NOTE — Progress Notes (Signed)
Patient ID: Sue Gutierrez, female   DOB: July 29, 1956, 59 y.o.   MRN: [id number] Subjective:     Sue Gutierrez is a 59 y.o. female here for a routine exam.  No LMP recorded. Patient is postmenopausal. No obstetric history on file. Birth Control Method:  Post menopausal Menstrual Calendar(currently): none  Current complaints: atrophic vaginitis.   Current acute medical issues:  none   Recent Gynecologic History No LMP recorded. Patient is postmenopausal. Last Pap: 2014,  normal Last mammogram: 2015,  normal  Past Medical History  Diagnosis Date  . 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 Melanoma in situ 2004    Left leg  . Palpitations 2012    Event recorder in 09/2011-multiple spells with palpitations and dyspnea, mostly with sinus rhythm but some with PACs or PVCs; normal TSH of 3.1 in 07/2011  . Salivary gland tumor   . Allergy     Past Surgical History  Procedure Laterality Date  . Salivary gland tumor    . Melanoma excision    . Carpal tunnel release      OB History    No data available      Social History   Social History  . Marital Status: Married    Spouse Name: N/A  . Number of Children: 2  . Years of Education: 16 yrs   Occupational History  . Special Education Teacher    Social History Main Topics  . Smoking status: Former Research scientist (life sciences)  . Smokeless tobacco: Never Used  . Alcohol Use: 0.5 oz/week    1 drink(s) per week     Comment: 1 drink per week  . Drug Use: None  . Sexual Activity: Not Asked   Other Topics Concern  . None   Social History Narrative    Family History  Problem Relation Age of Onset  . Colon cancer Neg Hx   . Esophageal cancer Neg Hx   . Stomach cancer Neg Hx   . Stroke Father      Current outpatient prescriptions:  .  cycloSPORINE (RESTASIS) 0.05 % ophthalmic emulsion, 1 drop 2 (two) times daily., Disp: , Rfl:  .  OSPHENA 60 MG TABS, TAKE ONE (1) TABLET EACH DAY, Disp: 30 tablet, Rfl: 11 .   conjugated estrogens (PREMARIN) vaginal cream, Place 1 Applicatorful vaginally daily., Disp: 42.5 g, Rfl: 12 .  cyclobenzaprine (FLEXERIL) 10 MG tablet, Take 1 tablet (10 mg total) by mouth at bedtime. (Patient not taking: Reported on 07/07/2015), Disp: 40 tablet, Rfl: 1 .  loratadine (CLARITIN) 10 MG tablet, Take 10 mg by mouth daily., Disp: , Rfl:   Review of Systems  Review of Systems  Constitutional: Negative for fever, chills, weight loss, malaise/fatigue and diaphoresis.  HENT: Negative for hearing loss, ear pain, nosebleeds, congestion, sore throat, neck pain, tinnitus and ear discharge.   Eyes: Negative for blurred vision, double vision, photophobia, pain, discharge and redness.  Respiratory: Negative for cough, hemoptysis, sputum production, shortness of breath, wheezing and stridor.   Cardiovascular: Negative for chest pain, palpitations, orthopnea, claudication, leg swelling and PND.  Gastrointestinal: negative for abdominal pain. Negative for heartburn, nausea, vomiting, diarrhea, constipation, blood in stool and melena.  Genitourinary: Negative for dysuria, urgency, frequency, hematuria and flank pain.  Musculoskeletal: Negative for myalgias, back pain, joint pain and falls.  Skin: Negative for itching and rash.  Neurological: Negative for dizziness, tingling, tremors, sensory change, speech change, focal weakness, seizures, loss of consciousness, weakness and headaches.  Endo/Heme/Allergies:  Negative for environmental allergies and polydipsia. Does not bruise/bleed easily.  Psychiatric/Behavioral: Negative for depression, suicidal ideas, hallucinations, memory loss and substance abuse. The patient is not nervous/anxious and does not have insomnia.        Objective:  Blood pressure 130/80, pulse 60, height 5\' 6"  (1.676 m), weight 134 lb 6.4 oz (60.963 kg).   Physical Exam  Vitals reviewed. Constitutional: She is oriented to person, place, and time. She appears well-developed and  well-nourished.  HENT:  Head: Normocephalic and atraumatic.        Right Ear: External ear normal.  Left Ear: External ear normal.  Nose: Nose normal.  Mouth/Throat: Oropharynx is clear and moist.  Eyes: Conjunctivae and EOM are normal. Pupils are equal, round, and reactive to light. Right eye exhibits no discharge. Left eye exhibits no discharge. No scleral icterus.  Neck: Normal range of motion. Neck supple. No tracheal deviation present. No thyromegaly present.  Cardiovascular: Normal rate, regular rhythm, normal heart sounds and intact distal pulses.  Exam reveals no gallop and no friction rub.   No murmur heard. Respiratory: Effort normal and breath sounds normal. No respiratory distress. She has no wheezes. She has no rales. She exhibits no tenderness.  GI: Soft. Bowel sounds are normal. She exhibits no distension and no mass. There is no tenderness. There is no rebound and no guarding.  Genitourinary:  Breasts no masses skin changes or nipple changes bilaterally      Vulva is normal without lesions Vagina is pink moist without discharge Cervix normal in appearance and pap is done Uterus is normal size shape and contour Adnexa is negative with normal sized ovaries  {Rectal    hemoccult negative, normal tone, no masses Musculoskeletal: Normal range of motion. She exhibits no edema and no tenderness.  Neurological: She is alert and oriented to person, place, and time. She has normal reflexes. She displays normal reflexes. No cranial nerve deficit. She exhibits normal muscle tone. Coordination normal.  Skin: Skin is warm and dry. No rash noted. No erythema. No pallor.  Psychiatric: She has a normal mood and affect. Her behavior is normal. Judgment and thought content normal.       Assessment:    Healthy female exam.    Plan:    Hormone replacement therapy: hormone replacement therapy: premarin vaginal cream. Mammogram ordered. Follow up in: 1 year.

## 2015-07-08 LAB — CYTOLOGY - PAP

## 2015-07-12 ENCOUNTER — Ambulatory Visit (HOSPITAL_COMMUNITY)
Admission: RE | Admit: 2015-07-12 | Discharge: 2015-07-12 | Disposition: A | Discharge status: Home / Self Care | Payer: BC Managed Care – PPO | Source: Ambulatory Visit | Attending: Obstetrics & Gynecology | Admitting: Obstetrics & Gynecology

## 2015-07-12 DIAGNOSIS — Z1231 Encounter for screening mammogram for malignant neoplasm of breast: Secondary | ICD-10-CM | POA: Insufficient documentation

## 2016-07-07 ENCOUNTER — Other Ambulatory Visit: Payer: BC Managed Care – PPO | Admitting: Obstetrics & Gynecology

## 2016-07-14 ENCOUNTER — Encounter: Payer: Self-pay | Admitting: Obstetrics & Gynecology

## 2016-07-14 ENCOUNTER — Other Ambulatory Visit (HOSPITAL_COMMUNITY)
Admission: RE | Admit: 2016-07-14 | Discharge: 2016-07-14 | Disposition: A | Discharge status: Home / Self Care | Payer: BC Managed Care – PPO | Source: Ambulatory Visit | Attending: Obstetrics & Gynecology | Admitting: Obstetrics & Gynecology

## 2016-07-14 ENCOUNTER — Ambulatory Visit (INDEPENDENT_AMBULATORY_CARE_PROVIDER_SITE_OTHER): Payer: BC Managed Care – PPO | Admitting: Obstetrics & Gynecology

## 2016-07-14 VITALS — BP 110/80 | HR 80 | Ht 65.0 in | Wt 135.0 lb

## 2016-07-14 DIAGNOSIS — Z1211 Encounter for screening for malignant neoplasm of colon: Secondary | ICD-10-CM

## 2016-07-14 DIAGNOSIS — Z01419 Encounter for gynecological examination (general) (routine) without abnormal findings: Secondary | ICD-10-CM | POA: Insufficient documentation

## 2016-07-14 DIAGNOSIS — Z1212 Encounter for screening for malignant neoplasm of rectum: Secondary | ICD-10-CM

## 2016-07-14 MED ORDER — ESTROGENS, CONJUGATED 0.625 MG/GM VA CREA: 1.0000 | TOPICAL_CREAM | Freq: Every day | VAGINAL | 12 refills | Status: DC

## 2016-07-14 NOTE — Progress Notes (Signed)
Subjective:     Sue Gutierrez is a 60 y.o. female here for a routine exam.  No LMP recorded. Patient is postmenopausal. No obstetric history on file. Birth Control Method:  Post menopausal Menstrual Calendar(currently): amenorrhea  Current complaints: none. Her vaginal atrophy symptoms have resolved on vaginal estrogen  Current acute medical issues:  none   Recent Gynecologic History No LMP recorded. Patient is postmenopausal. Last Pap: 2016,  normal Last mammogram: 07/14/15,  normal  Past Medical History:  Diagnosis Date  . Allergy   . 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 Melanoma in situ [facility]) 2004   Left leg  . Palpitations 2012   Event recorder in 09/2011-multiple spells with palpitations and dyspnea, mostly with sinus rhythm but some with PACs or PVCs; normal TSH of 3.1 in 07/2011  . Salivary gland tumor     Past Surgical History:  Procedure Laterality Date  . CARPAL TUNNEL RELEASE    . MELANOMA EXCISION    . salivary gland tumor      OB History    No data available      Social History   Social History  . Marital status: Married    Spouse name: N/A  . Number of children: 2  . 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 Smoking status: Former Research scientist (life sciences)  . Smokeless tobacco: Never Used  . Alcohol use 0.5 oz/week    1 drink(s) per week     Comment: 1 drink per week  . Drug use: Unknown  . Sexual activity: Not Asked   Other Topics Concern  . None   Social History Narrative  . None    Family History  Problem Relation Age of Onset  . Stroke Father   . Colon cancer Neg Hx   . Esophageal cancer Neg Hx   . Stomach cancer Neg Hx      Current Outpatient Prescriptions:  .  conjugated estrogens (PREMARIN) vaginal cream, Place 1 Applicatorful vaginally daily., Disp: 42.5 g, Rfl: 12 .  cycloSPORINE (RESTASIS) 0.05 % ophthalmic emulsion, 1 drop 2 (two) times daily.,  Disp: , Rfl:   Review of Systems  Review of Systems  Constitutional: Negative for fever, chills, weight loss, malaise/fatigue and diaphoresis.  HENT: Negative for hearing loss, ear pain, nosebleeds, congestion, sore throat, neck pain, tinnitus and ear discharge.   Eyes: Negative for blurred vision, double vision, photophobia, pain, discharge and redness.  Respiratory: Negative for cough, hemoptysis, sputum production, shortness of breath, wheezing and stridor.   Cardiovascular: Negative for chest pain, palpitations, orthopnea, claudication, leg swelling and PND.  Gastrointestinal: negative for abdominal pain. Negative for heartburn, nausea, vomiting, diarrhea, constipation, blood in stool and melena.  Genitourinary: Negative for dysuria, urgency, frequency, hematuria and flank pain.  Musculoskeletal: Negative for myalgias, back pain, joint pain and falls.  Skin: Negative for itching and rash.  Neurological: Negative for dizziness, tingling, tremors, sensory change, speech change, focal weakness, seizures, loss of consciousness, weakness and headaches.  Endo/Heme/Allergies: Negative for environmental allergies and polydipsia. Does not bruise/bleed easily.  Psychiatric/Behavioral: Negative for depression, suicidal ideas, hallucinations, memory loss and substance abuse. The patient is not nervous/anxious and does not have insomnia.        Objective:  Blood pressure 110/80, pulse 80, height 5\' 5"  (1.651 m), weight 135 lb (61.2 kg).   Physical Exam  Vitals reviewed. Constitutional: She is oriented to  person, place, and time. She appears well-developed and well-nourished.  HENT:  Head: Normocephalic and atraumatic.        Right Ear: External ear normal.  Left Ear: External ear normal.  Nose: Nose normal.  Mouth/Throat: Oropharynx is clear and moist.  Eyes: Conjunctivae and EOM are normal. Pupils are equal, round, and reactive to light. Right eye exhibits no discharge. Left eye exhibits no  discharge. No scleral icterus.  Neck: Normal range of motion. Neck supple. No tracheal deviation present. No thyromegaly present.  Cardiovascular: Normal rate, regular rhythm, normal heart sounds and intact distal pulses.  Exam reveals no gallop and no friction rub.   No murmur heard. Respiratory: Effort normal and breath sounds normal. No respiratory distress. She has no wheezes. She has no rales. She exhibits no tenderness.  GI: Soft. Bowel sounds are normal. She exhibits no distension and no mass. There is no tenderness. There is no rebound and no guarding.  Genitourinary:  Breasts no masses skin changes or nipple changes bilaterally      Vulva is normal without lesions Vagina is pink moist without discharge Cervix normal in appearance and pap is done Uterus is normal size shape and contour Adnexa is negative with normal sized ovaries  {Rectal    hemoccult negative, normal tone, no masses  Musculoskeletal: Normal range of motion. She exhibits no edema and no tenderness.  Neurological: She is alert and oriented to person, place, and time. She has normal reflexes. She displays normal reflexes. No cranial nerve deficit. She exhibits normal muscle tone. Coordination normal.  Skin: Skin is warm and dry. No rash noted. No erythema. No pallor.  Psychiatric: She has a normal mood and affect. Her behavior is normal. Judgment and thought content normal.       Medications Ordered at today's visit: Meds ordered this encounter  Medications  . conjugated estrogens (PREMARIN) vaginal cream    Sig: Place 1 Applicatorful vaginally daily.    Dispense:  42.5 g    Refill:  12    Other orders placed at today's visit: No orders of the defined types were placed in this encounter.     Assessment:    Healthy female exam.    Plan:    Mammogram ordered. Follow up in: 1 year. continue premarin cream     Return in about 1 year (around 07/14/2017) for yearly, with Dr Elonda Husky.

## 2016-07-18 LAB — CYTOLOGY - PAP

## 2016-08-24 ENCOUNTER — Other Ambulatory Visit: Payer: Self-pay | Admitting: Obstetrics & Gynecology

## 2016-08-24 DIAGNOSIS — Z1231 Encounter for screening mammogram for malignant neoplasm of breast: Secondary | ICD-10-CM

## 2016-09-11 ENCOUNTER — Ambulatory Visit (HOSPITAL_COMMUNITY)
Admission: RE | Admit: 2016-09-11 | Discharge: 2016-09-11 | Disposition: A | Discharge status: Home / Self Care | Payer: BC Managed Care – PPO | Source: Ambulatory Visit | Attending: Obstetrics & Gynecology | Admitting: Obstetrics & Gynecology

## 2016-09-11 DIAGNOSIS — Z1231 Encounter for screening mammogram for malignant neoplasm of breast: Secondary | ICD-10-CM | POA: Insufficient documentation

## 2016-10-06 ENCOUNTER — Encounter: Payer: Self-pay | Admitting: Obstetrics & Gynecology

## 2016-10-06 ENCOUNTER — Ambulatory Visit (INDEPENDENT_AMBULATORY_CARE_PROVIDER_SITE_OTHER): Payer: BC Managed Care – PPO | Admitting: Obstetrics & Gynecology

## 2016-10-06 VITALS — BP 112/62 | HR 68 | Wt 138.0 lb

## 2016-10-06 DIAGNOSIS — B081 Molluscum contagiosum: Secondary | ICD-10-CM | POA: Diagnosis not present

## 2016-10-06 NOTE — Progress Notes (Signed)
      Chief Complaint  Patient presents with  . gyn visit    bump on puberty area    Blood pressure 112/62, pulse 68, weight 138 lb (62.6 kg).  60 y.o. No obstetric history on file. No LMP recorded. Patient is postmenopausal. The current method of family planning is post menopausal status.  Outpatient Encounter Prescriptions as of 10/06/2016  Medication Sig  . conjugated estrogens (PREMARIN) vaginal cream Place 1 Applicatorful vaginally daily.  . cycloSPORINE (RESTASIS) 0.05 % ophthalmic emulsion 1 drop 2 (two) times daily.   No facility-administered encounter medications on file as of 10/06/2016.     Subjective Pt with small non itch rash showed up a couple of weeks ago on right inner thigh by knee then left now here and there on vulva  Objective Molluscum contagiosum  Pertinent ROS   Labs or studies     Impression Diagnoses this Encounter::   ICD-9-CM ICD-10-CM   1. Mollusca contagiosa 078.0 B08.1    started on leg now vulvar, bilateral    Established relevant diagnosis(es):   Plan/Recommendations: No orders of the defined types were placed in this encounter.   Labs or Scans Ordered: No orders of the defined types were placed in this encounter.   Management:: No real definitive therapy could use some topical salicyclic acid but should run its course Will follow up if needed  Follow up Return if symptoms worsen or fail to improve.       All questions were answered.  Past Medical History:  Diagnosis Date  . Allergy   . 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 Melanoma in situ [facility]) 2004   Left leg  . Palpitations 2012   Event recorder in 09/2011-multiple spells with palpitations and dyspnea, mostly with sinus rhythm but some with PACs or PVCs; normal TSH of 3.1 in 07/2011  . Salivary gland tumor     Past Surgical History:  Procedure Laterality Date  . CARPAL TUNNEL RELEASE    . MELANOMA EXCISION    . salivary gland tumor       OB History    No data available      Allergies  Allergen Reactions  . Erythromycin Rash  . Penicillins Rash  . Septra [Bactrim] Rash  . Sulfamethoxazole-Trimethoprim Rash    Social History   Social History  . Marital status: Married    Spouse name: N/A  . Number of children: 2  . 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 Smoking status: Former Research scientist (life sciences)  . Smokeless tobacco: Never Used  . Alcohol use 0.5 oz/week    1 drink(s) per week     Comment: 1 drink per week  . Drug use: Unknown  . Sexual activity: Not Asked   Other Topics Concern  . None   Social History Narrative  . None    Family History  Problem Relation Age of Onset  . Stroke Father   . Colon cancer Neg Hx   . Esophageal cancer Neg Hx   . Stomach cancer Neg Hx

## 2017-07-27 ENCOUNTER — Other Ambulatory Visit: Payer: Self-pay | Admitting: Obstetrics & Gynecology

## 2017-12-09 ENCOUNTER — Other Ambulatory Visit: Payer: Self-pay

## 2017-12-09 ENCOUNTER — Emergency Department (HOSPITAL_COMMUNITY)
Admission: EM | Admit: 2017-12-09 | Discharge: 2017-12-09 | Disposition: A | Discharge status: Home / Self Care | Payer: BC Managed Care – PPO | Attending: Emergency Medicine | Admitting: Emergency Medicine

## 2017-12-09 ENCOUNTER — Encounter (HOSPITAL_COMMUNITY): Payer: Self-pay | Admitting: Emergency Medicine

## 2017-12-09 DIAGNOSIS — Z859 Personal history of malignant neoplasm, unspecified: Secondary | ICD-10-CM | POA: Insufficient documentation

## 2017-12-09 DIAGNOSIS — Z79899 Other long term (current) drug therapy: Secondary | ICD-10-CM | POA: Insufficient documentation

## 2017-12-09 DIAGNOSIS — R58 Hemorrhage, not elsewhere classified: Secondary | ICD-10-CM | POA: Diagnosis not present

## 2017-12-09 DIAGNOSIS — Z87891 Personal history of nicotine dependence: Secondary | ICD-10-CM | POA: Diagnosis not present

## 2017-12-09 DIAGNOSIS — R52 Pain, unspecified: Secondary | ICD-10-CM

## 2017-12-09 LAB — CBC WITH DIFFERENTIAL/PLATELET
BASOS ABS: 0 10*3/uL (ref 0.0–0.1)
Basophils Relative: 1 %
Eosinophils Absolute: 0.1 10*3/uL (ref 0.0–0.7)
Eosinophils Relative: 1 %
HEMATOCRIT: 35.6 % — AB (ref 36.0–46.0)
HEMOGLOBIN: 11.5 g/dL — AB (ref 12.0–15.0)
LYMPHS PCT: 39 %
Lymphs Abs: 2.7 10*3/uL (ref 0.7–4.0)
MCH: 28 pg (ref 26.0–34.0)
MCHC: 32.3 g/dL (ref 30.0–36.0)
MCV: 86.6 fL (ref 78.0–100.0)
MONO ABS: 0.5 10*3/uL (ref 0.1–1.0)
Monocytes Relative: 7 %
NEUTROS ABS: 3.7 10*3/uL (ref 1.7–7.7)
Neutrophils Relative %: 52 %
Platelets: 179 10*3/uL (ref 150–400)
RBC: 4.11 MIL/uL (ref 3.87–5.11)
RDW: 13.4 % (ref 11.5–15.5)
WBC: 7 10*3/uL (ref 4.0–10.5)

## 2017-12-09 LAB — PROTIME-INR
INR: 1.01
Prothrombin Time: 13.2 seconds (ref 11.4–15.2)

## 2017-12-09 LAB — COMPREHENSIVE METABOLIC PANEL
ALK PHOS: 51 U/L (ref 38–126)
ALT: 27 U/L (ref 14–54)
AST: 24 U/L (ref 15–41)
Albumin: 3.7 g/dL (ref 3.5–5.0)
Anion gap: 7 (ref 5–15)
BILIRUBIN TOTAL: 0.3 mg/dL (ref 0.3–1.2)
BUN: 23 mg/dL — AB (ref 6–20)
CO2: 24 mmol/L (ref 22–32)
CREATININE: 0.87 mg/dL (ref 0.44–1.00)
Calcium: 8.9 mg/dL (ref 8.9–10.3)
Chloride: 110 mmol/L (ref 101–111)
GFR calc Af Amer: >60 mL/min (ref >60)
GFR calc non Af Amer: >60 mL/min (ref >60)
Glucose, Bld: 113 mg/dL — ABNORMAL HIGH (ref 65–99)
Potassium: 3.3 mmol/L — ABNORMAL LOW (ref 3.5–5.1)
Sodium: 141 mmol/L (ref 135–145)
TOTAL PROTEIN: 6.2 g/dL — AB (ref 6.5–8.1)

## 2017-12-09 NOTE — ED Triage Notes (Signed)
Pt reports a bruising on RT arm that follow vein starting on forearm and goes up underneath arm pit. Denies injury. Pt saw PCP yesterday and she was diagnosed with lymphangitis and prescribed antibiotics. States it has spread 6-7 inches in the past 24 hours.

## 2017-12-09 NOTE — ED Notes (Signed)
Cup of water given to pt. Pt comfortable at this time Family at bedside

## 2017-12-09 NOTE — ED Provider Notes (Signed)
[facility] EMERGENCY DEPARTMENT Provider Note   CSN: [id number] Arrival date & time: 12/09/17  1731     History   Chief Complaint Chief Complaint  Patient presents with  . Bleeding/Bruising    HPI Sue Gutierrez is a 62 y.o. female.  Patient presents with tenderness/bruising in the right upper extremity for the last couple of days.  Symptoms started in the distal humerus medially and extended into the proximal forearm and axilla.  No chest pain or dyspnea.  No prior bleeding disorder.  No fever, sweats, chills.  She does play a lot of tennis and uses that arm for obvious reasons.  She was seen at an urgent care center yesterday and prescribed a steroid shot and Levaquin.  She was told her diagnosis was "lymphangitis".  She has been able to perform her normal ADLs.      Past Medical History:  Diagnosis Date  . Allergy   . Cancer ([place])    melanoma  . 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 Melanoma in situ [facility]) 2004   Left leg  . Palpitations 2012   Event recorder in 09/2011-multiple spells with palpitations and dyspnea, mostly with sinus rhythm but some with PACs or PVCs; normal TSH of 3.1 in 07/2011  . Salivary gland tumor     Patient Active Problem List   Diagnosis Date Noted  . Trigger point of shoulder region 04/28/2014  . Trigger point of left shoulder region 03/17/2014  . Trigger thumb 02/18/2014  . Laboratory test 11/23/2011  . Degenerative joint disease   . Melanoma in situ ([place])   . Palpitations     Past Surgical History:  Procedure Laterality Date  . CARPAL TUNNEL RELEASE    . MELANOMA EXCISION    . salivary gland tumor      OB History    No data available       Home Medications    Prior to Admission medications   Medication Sig Start Date End Date Taking? Authorizing Provider  cycloSPORINE (RESTASIS) 0.05 % ophthalmic emulsion 1 drop 2 (two) times daily.   Yes [provider]  LEVOFLOXACIN PO Take 1 tablet by mouth  daily.   Yes [provider]  PREMARIN vaginal cream INSERT ONE GRAM INTO VAGINA ONCE DAILY 07/30/17  Yes Florian Buff, MD  UNKNOWN TO PATIENT Inject 1 Dose into the skin once.   Yes [provider]    Family History Family History  Problem Relation Age of Onset  . Stroke Father   . Colon cancer Neg Hx   . Esophageal cancer Neg Hx   . Stomach cancer Neg Hx     Social History Social History   Tobacco Use  . Smoking status: Former Research scientist (life sciences)  . Smokeless tobacco: Never Used  Substance Use Topics  . Alcohol use: Yes    Alcohol/week: 0.5 oz    Types: 1 Standard drinks or equivalent per week    Comment: occasionally  . Drug use: No     Allergies   Erythromycin; Macrolides and ketolides; Penicillins; Septra [bactrim]; and Sulfamethoxazole-trimethoprim   Review of Systems Review of Systems  All other systems reviewed and are negative.    Physical Exam Updated Vital Signs BP (!) 143/96 (BP Location: Left Arm)   Pulse 66   Temp 98 F (36.7 C) (Oral)   Resp 17   Ht 5\' 6"  (1.676 m)   Wt 61.7 kg (136 lb)   SpO2 100%  BMI 21.95 kg/m   Physical Exam  Constitutional: She is oriented to person, place, and time. She appears well-developed and well-nourished.  HENT:  Head: Normocephalic and atraumatic.  Eyes: Conjunctivae are normal.  Neck: Neck supple.  Cardiovascular: Normal rate and regular rhythm.  Pulmonary/Chest: Effort normal and breath sounds normal.  Abdominal: Soft. Bowel sounds are normal.  Musculoskeletal:  Right upper extremity: Vague intermittent ecchymosis (tender) on the medial aspect of the RUE from the axilla to the proximal forearm.  No palpable cord.  No evidence of cellulitis.  Neurological: She is alert and oriented to person, place, and time.  Skin: Skin is warm and dry.  Psychiatric: She has a normal mood and affect. Her behavior is normal.  Nursing note and vitals reviewed.    ED Treatments / Results  Labs (all labs  ordered are listed, but only abnormal results are displayed) Labs Reviewed  CBC WITH DIFFERENTIAL/PLATELET - Abnormal; Notable for the following components:      Result Value   Hemoglobin 11.5 (*)    HCT 35.6 (*)    All other components within normal limits  COMPREHENSIVE METABOLIC PANEL - Abnormal; Notable for the following components:   Potassium 3.3 (*)    Glucose, Bld 113 (*)    BUN 23 (*)    Total Protein 6.2 (*)    All other components within normal limits  PROTIME-INR    EKG  EKG Interpretation None       Radiology No results found.  Procedures Procedures (including critical care time)  Medications Ordered in ED Medications - No data to display   Initial Impression / Assessment and Plan / ED Course  I have reviewed the triage vital signs and the nursing notes.  Pertinent labs & imaging results that were available during my care of the patient were reviewed by me and considered in my medical decision making (see chart for details).     Uncertain etiology of ecchymosis in the right upper extremity.  She may have ruptured a small blood vessel while playing tennis.  I see no evidence of cellulitis.  No lymphadenopathy in the axilla.  She is slightly anemic; otherwise labs including INR were normal.  I do not think this is a deep venous thrombosis; however, I will obtain a ultrasound Monday.  We will not give subcu Lovenox secondary to the obvious bleeding at this time.  This was discussed with the patient, her sister, her son.  Final Clinical Impressions(s) / ED Diagnoses   Final diagnoses:  Ecchymosis    ED Discharge Orders        Ordered    US Venous Img Upper Uni Right     12/09/17 2128       Nat Christen, MD 12/09/17 2141

## 2017-12-09 NOTE — Discharge Instructions (Signed)
RN will give you phone number to call in the morning for your ultrasound of the right arm.  Discontinue the antibiotic.  Try to minimize activity in the right arm.  One of our emergency physicians will discuss the test results tomorrow.  If your symptoms worsen, you will need to be reexamined.

## 2017-12-11 ENCOUNTER — Ambulatory Visit (HOSPITAL_COMMUNITY)
Admission: RE | Admit: 2017-12-11 | Discharge: 2017-12-11 | Disposition: A | Discharge status: Home / Self Care | Payer: BC Managed Care – PPO | Source: Ambulatory Visit | Attending: Emergency Medicine | Admitting: Emergency Medicine

## 2017-12-11 DIAGNOSIS — R52 Pain, unspecified: Secondary | ICD-10-CM | POA: Insufficient documentation

## 2017-12-27 ENCOUNTER — Ambulatory Visit: Payer: BC Managed Care – PPO | Admitting: Orthopedic Surgery

## 2018-01-03 ENCOUNTER — Telehealth: Payer: Self-pay | Admitting: Orthopedic Surgery

## 2018-01-03 NOTE — Telephone Encounter (Signed)
Patient called wanting to been seen for her R Arm. She had canceled the appointment that she had on 12/27/17 because she felt that she couldn't wait that long. She said she did go to a doctor in [place] and he did x-rays. I told her that this would be like a second opinion and Dr. Aline Brochure would need to review that doctor's notes, xray report before we can schedule anything. I also told her that she would need to get the xray on a cd also. I explained to her that after Dr. Aline Brochure reviews everything he will let us know about an appointment. She agreed to get the necessary information and bring by the office for review sometime next week.

## 2018-06-07 ENCOUNTER — Other Ambulatory Visit: Payer: Self-pay | Admitting: Obstetrics & Gynecology

## 2018-06-11 ENCOUNTER — Telehealth: Payer: Self-pay | Admitting: *Deleted

## 2018-06-11 NOTE — Telephone Encounter (Signed)
Patient requesting refill on premarin.  Informed refill already sent.

## 2018-07-01 ENCOUNTER — Other Ambulatory Visit (HOSPITAL_COMMUNITY)
Admission: RE | Admit: 2018-07-01 | Discharge: 2018-07-01 | Disposition: A | Discharge status: Home / Self Care | Payer: BC Managed Care – PPO | Source: Ambulatory Visit | Attending: Obstetrics & Gynecology | Admitting: Obstetrics & Gynecology

## 2018-07-01 ENCOUNTER — Encounter: Payer: Self-pay | Admitting: Obstetrics & Gynecology

## 2018-07-01 ENCOUNTER — Ambulatory Visit (INDEPENDENT_AMBULATORY_CARE_PROVIDER_SITE_OTHER): Payer: BC Managed Care – PPO | Admitting: Obstetrics & Gynecology

## 2018-07-01 ENCOUNTER — Other Ambulatory Visit: Payer: Self-pay

## 2018-07-01 VITALS — BP 158/90 | HR 65 | Ht 65.0 in | Wt 137.0 lb

## 2018-07-01 DIAGNOSIS — Z01419 Encounter for gynecological examination (general) (routine) without abnormal findings: Secondary | ICD-10-CM | POA: Insufficient documentation

## 2018-07-01 MED ORDER — ESTROGENS, CONJUGATED 0.625 MG/GM VA CREA: TOPICAL_CREAM | VAGINAL | 6 refills | Status: DC

## 2018-07-01 NOTE — Progress Notes (Signed)
Subjective:     Sue Gutierrez is a 62 y.o. female here for a routine exam.  No LMP recorded. Patient is postmenopausal. G2P2 Birth Control Method:  Post menopausal Menstrual Calendar(currently): amenorrheic  Current complaints: none.   Current acute medical issues:  none   Recent Gynecologic History No LMP recorded. Patient is postmenopausal. Last Pap: 2017,  normal Last mammogram: 2017,  normal  Past Medical History:  Diagnosis Date  . Allergy   . Cancer ([place])    melanoma  . 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 Melanoma in situ [facility]) 2004   Left leg  . Palpitations 2012   Event recorder in 09/2011-multiple spells with palpitations and dyspnea, mostly with sinus rhythm but some with PACs or PVCs; normal TSH of 3.1 in 07/2011  . Salivary gland tumor     Past Surgical History:  Procedure Laterality Date  . CARPAL TUNNEL RELEASE    . MELANOMA EXCISION    . salivary gland tumor      OB History    Gravida  2   Para  2   Term      Preterm      AB      Living  2     SAB      TAB      Ectopic      Multiple      Live Births              Social History   Socioeconomic History  . Marital status: Divorced    Spouse name: Not on file  . Number of children: 2  . Years of education: 16 yrs  . Highest education level: Not on file  Occupational History  . Occupation: Chief Technology Officer  Social Needs  . Financial resource strain: Not on file  . Food insecurity:    Worry: Not on file    Inability: Not on file  . Transportation needs:    Medical: Not on file    Non-medical: Not on file  Tobacco Use  . Smoking status: Former Research scientist (life sciences)  . Smokeless tobacco: Never Used  Substance and Sexual Activity  . Alcohol use: Yes    Alcohol/week: 1.0 standard drinks    Types: 1 Standard drinks or equivalent per week    Comment: occasionally  . Drug use: No  . Sexual activity: Yes    Birth control/protection: Post-menopausal  Lifestyle   . Physical activity:    Days per week: Not on file    Minutes per session: Not on file  . Stress: Not on file  Relationships  . Social connections:    Talks on phone: Not on file    Gets together: Not on file    Attends religious service: Not on file    Active member of club or organization: Not on file    Attends meetings of clubs or organizations: Not on file    Relationship status: Not on file  Other Topics Concern  . Not on file  Social History Narrative  . Not on file    Family History  Problem Relation Age of Onset  . Stroke Father   . Cancer Brother   . Colon cancer Neg Hx   . Esophageal cancer Neg Hx   . Stomach cancer Neg Hx      Current Outpatient Medications:  .  conjugated estrogens (PREMARIN) vaginal cream, INSERT ONE GRAM INTO THE VAGINA ONCE DAILY, Disp: 30 g, Rfl: 6 .  cycloSPORINE (RESTASIS) 0.05 % ophthalmic emulsion, 1 drop 2 (two) times daily., Disp: , Rfl:   Review of Systems  Review of Systems  Constitutional: Negative for fever, chills, weight loss, malaise/fatigue and diaphoresis.  HENT: Negative for hearing loss, ear pain, nosebleeds, congestion, sore throat, neck pain, tinnitus and ear discharge.   Eyes: Negative for blurred vision, double vision, photophobia, pain, discharge and redness.  Respiratory: Negative for cough, hemoptysis, sputum production, shortness of breath, wheezing and stridor.   Cardiovascular: Negative for chest pain, palpitations, orthopnea, claudication, leg swelling and PND.  Gastrointestinal: negative for abdominal pain. Negative for heartburn, nausea, vomiting, diarrhea, constipation, blood in stool and melena.  Genitourinary: Negative for dysuria, urgency, frequency, hematuria and flank pain.  Musculoskeletal: Negative for myalgias, back pain, joint pain and falls.  Skin: Negative for itching and rash.  Neurological: Negative for dizziness, tingling, tremors, sensory change, speech change, focal weakness, seizures, loss  of consciousness, weakness and headaches.  Endo/Heme/Allergies: Negative for environmental allergies and polydipsia. Does not bruise/bleed easily.  Psychiatric/Behavioral: Negative for depression, suicidal ideas, hallucinations, memory loss and substance abuse. The patient is not nervous/anxious and does not have insomnia.        Objective:  Blood pressure (!) 158/90, pulse 65, height 5\' 5"  (1.651 m), weight 137 lb (62.1 kg).   Physical Exam  Vitals reviewed. Constitutional: She is oriented to person, place, and time. She appears well-developed and well-nourished.  HENT:  Head: Normocephalic and atraumatic.        Right Ear: External ear normal.  Left Ear: External ear normal.  Nose: Nose normal.  Mouth/Throat: Oropharynx is clear and moist.  Eyes: Conjunctivae and EOM are normal. Pupils are equal, round, and reactive to light. Right eye exhibits no discharge. Left eye exhibits no discharge. No scleral icterus.  Neck: Normal range of motion. Neck supple. No tracheal deviation present. No thyromegaly present.  Cardiovascular: Normal rate, regular rhythm, normal heart sounds and intact distal pulses.  Exam reveals no gallop and no friction rub.   No murmur heard. Respiratory: Effort normal and breath sounds normal. No respiratory distress. She has no wheezes. She has no rales. She exhibits no tenderness.  GI: Soft. Bowel sounds are normal. She exhibits no distension and no mass. There is no tenderness. There is no rebound and no guarding.  Genitourinary:  Breasts no masses skin changes or nipple changes bilaterally      Vulva is normal without lesions Vagina is pink moist without discharge Cervix normal in appearance and pap is done Uterus is normal size shape and contour Adnexa is negative with normal sized ovaries  {Rectal    hemoccult negative, normal tone, no masses  Musculoskeletal: Normal range of motion. She exhibits no edema and no tenderness.  Neurological: She is alert and  oriented to person, place, and time. She has normal reflexes. She displays normal reflexes. No cranial nerve deficit. She exhibits normal muscle tone. Coordination normal.  Skin: Skin is warm and dry. No rash noted. No erythema. No pallor.  Psychiatric: She has a normal mood and affect. Her behavior is normal. Judgment and thought content normal.       Medications Ordered at today's visit: Meds ordered this encounter  Medications  . conjugated estrogens (PREMARIN) vaginal cream    Sig: INSERT ONE GRAM INTO THE VAGINA ONCE DAILY    Dispense:  30 g    Refill:  6    Other orders placed at today's visit: No orders of the defined types were  placed in this encounter.     Assessment:    Healthy female exam.    Plan:    Hormone replacement therapy: hormone replacement therapy: estrogen vaginal cream. Mammogram ordered. Follow up in: 2 years.     No follow-ups on file.

## 2018-07-04 LAB — CYTOLOGY - PAP
DIAGNOSIS: NEGATIVE
HPV: NOT DETECTED

## 2019-04-07 ENCOUNTER — Other Ambulatory Visit: Payer: Self-pay | Admitting: Obstetrics & Gynecology

## 2019-06-20 ENCOUNTER — Other Ambulatory Visit: Payer: Self-pay

## 2019-06-20 DIAGNOSIS — Z20822 Contact with and (suspected) exposure to covid-19: Secondary | ICD-10-CM

## 2019-06-22 LAB — NOVEL CORONAVIRUS, NAA: SARS-CoV-2, NAA: NOT DETECTED

## 2019-08-15 ENCOUNTER — Other Ambulatory Visit: Payer: Self-pay

## 2019-08-15 DIAGNOSIS — Z20822 Contact with and (suspected) exposure to covid-19: Secondary | ICD-10-CM

## 2019-08-16 LAB — NOVEL CORONAVIRUS, NAA: SARS-CoV-2, NAA: NOT DETECTED

## 2019-08-18 ENCOUNTER — Ambulatory Visit
Admission: EM | Admit: 2019-08-18 | Discharge: 2019-08-18 | Disposition: A | Discharge status: Home / Self Care | Payer: BC Managed Care – PPO | Attending: Emergency Medicine | Admitting: Emergency Medicine

## 2019-08-18 DIAGNOSIS — Z20828 Contact with and (suspected) exposure to other viral communicable diseases: Secondary | ICD-10-CM | POA: Insufficient documentation

## 2019-08-18 DIAGNOSIS — J029 Acute pharyngitis, unspecified: Secondary | ICD-10-CM | POA: Insufficient documentation

## 2019-08-18 DIAGNOSIS — Z20822 Contact with and (suspected) exposure to covid-19: Secondary | ICD-10-CM

## 2019-08-18 LAB — POCT RAPID STREP A (OFFICE): Rapid Strep A Screen: NEGATIVE

## 2019-08-18 NOTE — ED Provider Notes (Signed)
[place]   [id number] 08/18/19 Arrival Time: [id number]  [id number] THROAT  SUBJECTIVE: History from: patient.  Sue Gutierrez is a 63 y.o. female who presents with abrupt onset of sore throat x 3 days.  Denies sick exposure to COVID, flu or strep.  Denies recent travel.  Has tried OTC medications.  Symptoms are made worse with swallowing, but tolerating liquids and own secretions without difficulty.  Reports previous symptoms in the past related to allergies.   Had COVID testing done, and was negative.  Complains of mild ear pressure, nasal congestion, and runny nose. Denies fever, chills, fatigue, cough, SOB, wheezing, chest pain, nausea, vomiting, changes in bowel or bladder habits.    ROS: As per HPI.  All other pertinent ROS negative.     Past Medical History:  Diagnosis Date  . Allergy   . Cancer ([place])    melanoma  . 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 Melanoma in situ [facility]) 2004   Left leg  . Palpitations 2012   Event recorder in 09/2011-multiple spells with palpitations and dyspnea, mostly with sinus rhythm but some with PACs or PVCs; normal TSH of 3.1 in 07/2011  . Salivary gland tumor    Past Surgical History:  Procedure Laterality Date  . CARPAL TUNNEL RELEASE    . MELANOMA EXCISION    . salivary gland tumor     Allergies  Allergen Reactions  . Erythromycin Rash  . Macrolides And Ketolides Other (See Comments)    Other Reaction: Allergy  . Penicillins Rash    .Has patient had a PCN reaction causing immediate rash, facial/tongue/throat swelling, SOB or lightheadedness with hypotension: Yes Has patient had a PCN reaction causing severe rash involving mucus membranes or skin necrosis: No Has patient had a PCN reaction that required hospitalization: No Has patient had a PCN reaction occurring within the last 10 years: No If all of the above answers are "NO", then may proceed with Cephalosporin use.   Sarina Ill [Bactrim] Rash  .  Sulfamethoxazole-Trimethoprim Rash   No current facility-administered medications on file prior to encounter.    Current Outpatient Medications on File Prior to Encounter  Medication Sig Dispense Refill  . conjugated estrogens (PREMARIN) vaginal cream INSERT ONE GRAM INTO THE VAGINA ONCE DAILY 30 g 6  . cycloSPORINE (RESTASIS) 0.05 % ophthalmic emulsion 1 drop 2 (two) times daily.     Social History   Socioeconomic History  . Marital status: Divorced    Spouse name: Not on file  . Number of children: 2  . Years of education: 16 yrs  . Highest education level: Not on file  Occupational History  . Occupation: Chief Technology Officer  Social Needs  . Financial resource strain: Not on file  . Food insecurity    Worry: Not on file    Inability: Not on file  . Transportation needs    Medical: Not on file    Non-medical: Not on file  Tobacco Use  . Smoking status: Former Research scientist (life sciences)  . Smokeless tobacco: Never Used  Substance and Sexual Activity  . Alcohol use: Yes    Alcohol/week: 1.0 standard drinks    Types: 1 Standard drinks or equivalent per week    Comment: occasionally  . Drug use: No  . Sexual activity: Yes    Birth control/protection: Post-menopausal  Lifestyle  . Physical activity    Days per week: Not on file    Minutes per session: Not on file  .  Stress: Not on file  Relationships  . Social Herbalist on phone: Not on file    Gets together: Not on file    Attends religious service: Not on file    Active member of club or organization: Not on file    Attends meetings of clubs or organizations: Not on file    Relationship status: Not on file  . Intimate partner violence    Fear of current or ex partner: Not on file    Emotionally abused: Not on file    Physically abused: Not on file    Forced sexual activity: Not on file  Other Topics Concern  . Not on file  Social History Narrative  . Not on file   Family History  Problem Relation Age of Onset   . Stroke Father   . Cancer Brother   . Colon cancer Neg Hx   . Esophageal cancer Neg Hx   . Stomach cancer Neg Hx     OBJECTIVE:  Vitals:   08/18/19 1659  BP: (!) 146/78  Pulse: 67  Resp: 16  Temp: 98 F (36.7 C)  TempSrc: Oral  SpO2: 98%     General appearance: alert; appears mildly fatigued, but nontoxic, speaking in full sentences and managing own secretions HEENT: NCAT; Ears: EACs clear, TMs pearly gray with visible cone of light, without erythema; Eyes: PERRL, EOMI grossly; Nose: no obvious rhinorrhea; Throat: oropharynx mildly erythematous, tonsils not enlarged or erythematous without white tonsillar exudates, uvula midline Neck: supple without LAD Lungs: CTA bilaterally without adventitious breath sounds; cough absent Heart: regular rate and rhythm.   Skin: warm and dry Psychological: alert and cooperative; normal mood and affect  LABS: Results for orders placed or performed during the hospital encounter of 08/18/19 (from the past 24 hour(s))  POCT rapid strep A     Status: None   Collection Time: 08/18/19  5:19 PM  Result Value Ref Range   Rapid Strep A Screen Negative Negative    ASSESSMENT & PLAN:  1. Suspected COVID-19 virus infection   2. Sore throat    Strep negative.  Culture sent.   COVID testing ordered.  It will take between 5-7 days for test results.  Someone will contact you regarding abnormal results.    In the meantime: You should remain isolated in your home for 10 days from symptom onset AND greater than 72 hours after symptoms resolution (absence of fever without the use of fever-reducing medication and improvement in respiratory symptoms), whichever is longer Get plenty of rest and push fluids You may use OTC zyrtec as needed for nasal congestion, runny nose, and/or sore throat You may use OTC flonase as needed for nasal congestion and runny nose Use medications daily for symptom relief Use OTC medications like ibuprofen or tylenol as needed  fever or pain Call or go to the ED if you have any new or worsening symptoms such as fever, worsening cough, shortness of breath, chest tightness, chest pain, turning blue, changes in mental status, etc...   Reviewed expectations re: course of current medical issues. Questions answered. Outlined signs and symptoms indicating need for more acute intervention. Patient verbalized understanding. After Visit Summary given.        Lestine Box, PA-C 08/18/19 1752

## 2019-08-18 NOTE — ED Triage Notes (Signed)
Pt presents with sore throat that she had since Friday , went for covid testing and resulted negative

## 2019-08-18 NOTE — Discharge Instructions (Addendum)
COVID testing ordered.  It will take between 5-7 days for test results.  Someone will contact you regarding abnormal results.    In the meantime: You should remain isolated in your home for 10 days from symptom onset AND greater than 72 hours after symptoms resolution (absence of fever without the use of fever-reducing medication and improvement in respiratory symptoms), whichever is longer Get plenty of rest and push fluids You may use OTC zyrtec as needed for nasal congestion, runny nose, and/or sore throat You may use OTC flonase as needed for nasal congestion and runny nose Use medications daily for symptom relief Use OTC medications like ibuprofen or tylenol as needed fever or pain Call or go to the ED if you have any new or worsening symptoms such as fever, worsening cough, shortness of breath, chest tightness, chest pain, turning blue, changes in mental status, etc..Marland Kitchen

## 2019-08-19 LAB — NOVEL CORONAVIRUS, NAA: SARS-CoV-2, NAA: NOT DETECTED

## 2019-08-21 LAB — CULTURE, GROUP A STREP (THRC)

## 2019-12-01 ENCOUNTER — Ambulatory Visit: Payer: BC Managed Care – PPO | Attending: Internal Medicine

## 2019-12-01 ENCOUNTER — Other Ambulatory Visit: Payer: Self-pay

## 2019-12-01 DIAGNOSIS — Z20822 Contact with and (suspected) exposure to covid-19: Secondary | ICD-10-CM

## 2019-12-02 LAB — NOVEL CORONAVIRUS, NAA: SARS-CoV-2, NAA: NOT DETECTED

## 2019-12-14 ENCOUNTER — Ambulatory Visit: Payer: BC Managed Care – PPO | Attending: Internal Medicine

## 2019-12-14 DIAGNOSIS — Z23 Encounter for immunization: Secondary | ICD-10-CM | POA: Insufficient documentation

## 2019-12-14 NOTE — Progress Notes (Signed)
   Covid-19 Vaccination Clinic  Name:  Glennetta Vieweg    MRN: [id number] DOB: July 07, 1956  12/14/2019  [name] was observed post Covid-19 immunization for 15 minutes without incident. She was provided with Vaccine Information Sheet and instruction to access the V-Safe system.   Ms. Mathies was instructed to call 911 with any severe reactions post vaccine: Marland Kitchen Difficulty breathing  . Swelling of face and throat  . A fast heartbeat  . A bad rash all over body  . Dizziness and weakness   Immunizations Administered    Name Date Dose VIS Date Route   Pfizer COVID-19 Vaccine 12/14/2019 11:20 AM 0.3 mL 09/19/2019 Intramuscular   Manufacturer: [place]   Lot: [id number]   [place]: [id number]

## 2019-12-17 ENCOUNTER — Telehealth: Payer: Self-pay | Admitting: *Deleted

## 2019-12-17 NOTE — Telephone Encounter (Signed)
Pt came by office to pick up Premarin vaginal cream samples 0.625 mg. Lot # [id number] exp 12/21. [place]

## 2020-01-04 ENCOUNTER — Ambulatory Visit: Payer: BC Managed Care – PPO | Attending: Internal Medicine

## 2020-01-04 DIAGNOSIS — Z23 Encounter for immunization: Secondary | ICD-10-CM

## 2020-01-04 NOTE — Progress Notes (Signed)
   Covid-19 Vaccination Clinic  Name:  Sue Gutierrez    MRN: [id number] DOB: 1955/11/20  01/04/2020  [name] was observed post Covid-19 immunization for 15 minutes without incident. She was provided with Vaccine Information Sheet and instruction to access the V-Safe system.   [name] was instructed to call 911 with any severe reactions post vaccine: Marland Kitchen Difficulty breathing  . Swelling of face and throat  . A fast heartbeat  . A bad rash all over body  . Dizziness and weakness   Immunizations Administered    Name Date Dose VIS Date Route   Pfizer COVID-19 Vaccine 01/04/2020 10:40 AM 0.3 mL 09/19/2019 Intramuscular   Manufacturer: [place]   Lot: [id number]   [place]: [id number]

## 2020-02-19 ENCOUNTER — Telehealth: Payer: Self-pay | Admitting: Obstetrics & Gynecology

## 2020-02-19 MED ORDER — ESTRADIOL 2 MG PO TABS: 2.0000 mg | ORAL_TABLET | Freq: Every day | ORAL | 11 refills | Status: DC

## 2020-05-19 ENCOUNTER — Other Ambulatory Visit (HOSPITAL_COMMUNITY): Payer: Self-pay | Admitting: Obstetrics & Gynecology

## 2020-05-19 DIAGNOSIS — Z1231 Encounter for screening mammogram for malignant neoplasm of breast: Secondary | ICD-10-CM

## 2020-06-04 ENCOUNTER — Ambulatory Visit (HOSPITAL_COMMUNITY)
Admission: RE | Admit: 2020-06-04 | Discharge: 2020-06-04 | Disposition: A | Discharge status: Home / Self Care | Payer: BC Managed Care – PPO | Source: Ambulatory Visit | Attending: Obstetrics & Gynecology | Admitting: Obstetrics & Gynecology

## 2020-06-04 ENCOUNTER — Other Ambulatory Visit: Payer: Self-pay

## 2020-06-04 DIAGNOSIS — Z1231 Encounter for screening mammogram for malignant neoplasm of breast: Secondary | ICD-10-CM | POA: Insufficient documentation

## 2020-06-11 ENCOUNTER — Other Ambulatory Visit: Payer: Self-pay | Admitting: *Deleted

## 2020-06-11 DIAGNOSIS — Z1211 Encounter for screening for malignant neoplasm of colon: Secondary | ICD-10-CM

## 2020-06-15 ENCOUNTER — Encounter (INDEPENDENT_AMBULATORY_CARE_PROVIDER_SITE_OTHER): Payer: Self-pay | Admitting: *Deleted

## 2020-08-11 ENCOUNTER — Other Ambulatory Visit (INDEPENDENT_AMBULATORY_CARE_PROVIDER_SITE_OTHER): Payer: Self-pay

## 2020-08-11 DIAGNOSIS — Z1211 Encounter for screening for malignant neoplasm of colon: Secondary | ICD-10-CM

## 2020-08-13 ENCOUNTER — Telehealth (INDEPENDENT_AMBULATORY_CARE_PROVIDER_SITE_OTHER): Payer: Self-pay

## 2020-08-13 ENCOUNTER — Encounter (INDEPENDENT_AMBULATORY_CARE_PROVIDER_SITE_OTHER): Payer: Self-pay

## 2020-08-13 MED ORDER — PLENVU 140 G PO SOLR: 1.0000 | Freq: Once | ORAL | 0 refills | Status: AC

## 2020-08-13 NOTE — Telephone Encounter (Signed)
Ok to schedule.  Thanks,  Ketih Goodie Castaneda Mayorga, MD Gastroenterology and Hepatology [facility]  

## 2020-08-13 NOTE — Telephone Encounter (Signed)
Patient has Plenvu (copay card)

## 2020-08-13 NOTE — Telephone Encounter (Signed)
Referring MD/PCP: Eure   Procedure: TCS  Reason/Indication:  Screening  Has patient had this procedure before?  Yes 10 yrs   If so, when, by whom and where?    Is there a family history of colon cancer?  no  Who?  What age when diagnosed?    Is patient diabetic?   no      Does patient have prosthetic heart valve or mechanical valve? no  Do you have a pacemaker/defibrillator?  no  Has patient ever had endocarditis/atrial fibrillation? no  Does patient use oxygen? no  Has patient had joint replacement within last 12 months?  no  Is patient constipated or do they take laxatives? no  Does patient have a history of alcohol/drug use?  no  Is patient on blood thinner such as Coumadin, Plavix and/or Aspirin? no  Medications: restasis bid  Allergies: PCN, E-mycin  Medication Adjustment per Dr Jenetta Downer none  Procedure date & time: 09/14/20 10:45

## 2020-09-13 ENCOUNTER — Other Ambulatory Visit: Payer: Self-pay

## 2020-09-13 ENCOUNTER — Other Ambulatory Visit (HOSPITAL_COMMUNITY)
Admission: RE | Admit: 2020-09-13 | Discharge: 2020-09-13 | Disposition: A | Discharge status: Home / Self Care | Payer: BC Managed Care – PPO | Source: Ambulatory Visit | Attending: Gastroenterology | Admitting: Gastroenterology

## 2020-09-13 DIAGNOSIS — Z85820 Personal history of malignant melanoma of skin: Secondary | ICD-10-CM | POA: Diagnosis not present

## 2020-09-13 DIAGNOSIS — Z1211 Encounter for screening for malignant neoplasm of colon: Secondary | ICD-10-CM | POA: Diagnosis not present

## 2020-09-13 DIAGNOSIS — Z20822 Contact with and (suspected) exposure to covid-19: Secondary | ICD-10-CM | POA: Insufficient documentation

## 2020-09-13 DIAGNOSIS — Z87891 Personal history of nicotine dependence: Secondary | ICD-10-CM | POA: Diagnosis not present

## 2020-09-13 DIAGNOSIS — Z79899 Other long term (current) drug therapy: Secondary | ICD-10-CM | POA: Diagnosis not present

## 2020-09-13 DIAGNOSIS — Z79890 Hormone replacement therapy (postmenopausal): Secondary | ICD-10-CM | POA: Diagnosis not present

## 2020-09-13 DIAGNOSIS — Z01812 Encounter for preprocedural laboratory examination: Secondary | ICD-10-CM | POA: Insufficient documentation

## 2020-09-13 LAB — SARS CORONAVIRUS 2 (TAT 6-24 HRS): SARS Coronavirus 2: NEGATIVE

## 2020-09-14 ENCOUNTER — Other Ambulatory Visit: Payer: Self-pay

## 2020-09-14 ENCOUNTER — Ambulatory Visit (HOSPITAL_COMMUNITY)
Admission: RE | Admit: 2020-09-14 | Discharge: 2020-09-14 | Disposition: A | Discharge status: Home / Self Care | Payer: BC Managed Care – PPO | Attending: Gastroenterology | Admitting: Gastroenterology

## 2020-09-14 ENCOUNTER — Ambulatory Visit (HOSPITAL_COMMUNITY): Payer: BC Managed Care – PPO | Admitting: Anesthesiology

## 2020-09-14 ENCOUNTER — Encounter (HOSPITAL_COMMUNITY)
Admission: RE | Disposition: A | Discharge status: Home / Self Care | Payer: Self-pay | Source: Home / Self Care | Attending: Gastroenterology

## 2020-09-14 ENCOUNTER — Encounter (INDEPENDENT_AMBULATORY_CARE_PROVIDER_SITE_OTHER): Payer: Self-pay | Admitting: *Deleted

## 2020-09-14 ENCOUNTER — Encounter (HOSPITAL_COMMUNITY): Payer: Self-pay | Admitting: Gastroenterology

## 2020-09-14 DIAGNOSIS — Z79899 Other long term (current) drug therapy: Secondary | ICD-10-CM | POA: Insufficient documentation

## 2020-09-14 DIAGNOSIS — Z79890 Hormone replacement therapy (postmenopausal): Secondary | ICD-10-CM | POA: Insufficient documentation

## 2020-09-14 DIAGNOSIS — Z87891 Personal history of nicotine dependence: Secondary | ICD-10-CM | POA: Insufficient documentation

## 2020-09-14 DIAGNOSIS — Z20822 Contact with and (suspected) exposure to covid-19: Secondary | ICD-10-CM | POA: Insufficient documentation

## 2020-09-14 DIAGNOSIS — Z1211 Encounter for screening for malignant neoplasm of colon: Secondary | ICD-10-CM | POA: Diagnosis not present

## 2020-09-14 DIAGNOSIS — Z85820 Personal history of malignant melanoma of skin: Secondary | ICD-10-CM | POA: Insufficient documentation

## 2020-09-14 HISTORY — PX: COLONOSCOPY WITH PROPOFOL: SHX5780

## 2020-09-14 LAB — HM COLONOSCOPY

## 2020-09-14 SURGERY — COLONOSCOPY WITH PROPOFOL
Anesthesia: General

## 2020-09-14 MED ORDER — LACTATED RINGERS IV SOLN: Freq: Once | INTRAVENOUS | Status: AC

## 2020-09-14 MED ORDER — CHLORHEXIDINE GLUCONATE CLOTH 2 % EX PADS: 6.0000 | MEDICATED_PAD | Freq: Once | CUTANEOUS | Status: DC

## 2020-09-14 MED ORDER — PROPOFOL 10 MG/ML IV BOLUS
INTRAVENOUS | Status: DC | PRN
  Administered 2020-09-14: 80 mg via INTRAVENOUS
  Administered 2020-09-14: 125 ug/kg/min via INTRAVENOUS

## 2020-09-14 MED ORDER — LACTATED RINGERS IV SOLN: INTRAVENOUS | Status: DC | PRN

## 2020-09-14 MED ORDER — LIDOCAINE HCL (CARDIAC) PF 100 MG/5ML IV SOSY
PREFILLED_SYRINGE | INTRAVENOUS | Status: DC | PRN
  Administered 2020-09-14: 60 mg via INTRAVENOUS

## 2020-09-14 NOTE — Anesthesia Preprocedure Evaluation (Addendum)
Anesthesia Evaluation  Patient identified by MRN, date of birth, ID band Patient awake    Reviewed: Allergy & Precautions, NPO status , Patient's Chart, lab work & pertinent test results  History of Anesthesia Complications Negative for: history of anesthetic complications  Airway Mallampati: II  TM Distance: >3 FB Neck ROM: Full    Dental  (+) Dental Advisory Given, Teeth Intact Veneers  :   Pulmonary former smoker,    Pulmonary exam normal breath sounds clear to auscultation       Cardiovascular Exercise Tolerance: Good Normal cardiovascular exam+ dysrhythmias (h/o PACs, PVCs 10 years ago)  Rhythm:Regular Rate:Normal     Neuro/Psych  Neuromuscular disease negative psych ROS   GI/Hepatic negative GI ROS, Neg liver ROS,   Endo/Other  negative endocrine ROS  Renal/GU negative Renal ROS     Musculoskeletal  (+) Arthritis ,   Abdominal   Peds  Hematology negative hematology ROS (+)   Anesthesia Other Findings   Reproductive/Obstetrics negative OB ROS                            Anesthesia Physical Anesthesia Plan  ASA: II  Anesthesia Plan: General   Post-op Pain Management:    Induction: Intravenous  PONV Risk Score and Plan: TIVA  Airway Management Planned: Nasal Cannula and Natural Airway  Additional Equipment:   Intra-op Plan:   Post-operative Plan:   Informed Consent: I have reviewed the patients History and Physical, chart, labs and discussed the procedure including the risks, benefits and alternatives for the proposed anesthesia with the patient or authorized representative who has indicated his/her understanding and acceptance.     Dental advisory given  Plan Discussed with: CRNA and Surgeon  Anesthesia Plan Comments:         Anesthesia Quick Evaluation

## 2020-09-14 NOTE — Transfer of Care (Signed)
Immediate Anesthesia Transfer of Care Note  Patient: Sue Gutierrez  Procedure(s) Performed: COLONOSCOPY WITH PROPOFOL (N/A )  Patient Location: Endoscopy Unit  Anesthesia Type:General  Level of Consciousness: awake, alert , oriented and patient cooperative  Airway & Oxygen Therapy: Patient Spontanous Breathing  Post-op Assessment: Report given to RN, Post -op Vital signs reviewed and stable and Patient moving all extremities  Post vital signs: Reviewed and stable  Last Vitals:  Vitals Value Taken Time  BP 116/64 09/14/20 1006  Temp    Pulse 61 09/14/20 1006  Resp 12 09/14/20 1006  SpO2 97 % 09/14/20 1006    Last Pain:  Vitals:   09/14/20 0917  TempSrc: Oral  PainSc: 0-No pain      Patients Stated Pain Goal: 7 (75/05/18 3358)  Complications: No complications documented.

## 2020-09-14 NOTE — H&P (Signed)
Sue Gutierrez is an 64 y.o. female.   Chief Complaint: screening colonoscopy HPI: 64 y/o F with PMH melanoma s/p resection, coming for screening colonoscopy. The patient  had a colonoscopy on 07/17/2011 with normal colon findings. The patient denies having any complaints such as melena, hematochezia, abdominal pain or distention, change in her bowel movement consistency or frequency, no changes in her weight recently.  No family history of colorectal cancer.   Past Medical History:  Diagnosis Date  . Allergy   . Cancer ([place])    melanoma  . 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 Melanoma in situ [facility]) 2004   Left leg  . Palpitations 2012   Event recorder in 09/2011-multiple spells with palpitations and dyspnea, mostly with sinus rhythm but some with PACs or PVCs; normal TSH of 3.1 in 07/2011  . Salivary gland tumor     Past Surgical History:  Procedure Laterality Date  . CARPAL TUNNEL RELEASE    . MELANOMA EXCISION    . salivary gland tumor      Family History  Problem Relation Age of Onset  . Stroke Father   . Cancer Brother   . Colon cancer Neg Hx   . Esophageal cancer Neg Hx   . Stomach cancer Neg Hx    Social History:  reports that she has quit smoking. She has never used smokeless tobacco. She reports current alcohol use of about 1.0 standard drink of alcohol per week. She reports that she does not use drugs.  Allergies:  Allergies  Allergen Reactions  . Erythromycin Rash  . Penicillins Rash    .Has patient had a PCN reaction causing immediate rash, facial/tongue/throat swelling, SOB or lightheadedness with hypotension: Yes Has patient had a PCN reaction causing severe rash involving mucus membranes or skin necrosis: No Has patient had a PCN reaction that required hospitalization: No Has patient had a PCN reaction occurring within the last 10 years: No If all of the above answers are "NO", then may proceed with Cephalosporin use.   Sarina Ill  [Bactrim] Rash  . Sulfamethoxazole-Trimethoprim Rash    Medications Prior to Admission  Medication Sig Dispense Refill  . acetaminophen (TYLENOL) 500 MG tablet Take 1,000 mg by mouth every 6 (six) hours as needed (pain.).    Marland Kitchen naproxen sodium (ALEVE) 220 MG tablet Take 220 mg by mouth 2 (two) times daily as needed (pain.).    Marland Kitchen Polyethyl Glycol-Propyl Glycol (SYSTANE) 0.4-0.3 % SOLN Place 1 drop into both eyes in the morning and at bedtime.    . conjugated estrogens (PREMARIN) vaginal cream INSERT ONE GRAM INTO THE VAGINA ONCE DAILY 30 g 6  . estradiol (ESTRACE) 2 MG tablet Take 1 tablet (2 mg total) by mouth daily. (Patient not taking: Reported on 09/07/2020) 30 tablet 11    Results for orders placed or performed during the hospital encounter of 09/13/20 (from the past 48 hour(s))  SARS CORONAVIRUS 2 (TAT 6-24 HRS) Nasopharyngeal Nasopharyngeal Swab     Status: None   Collection Time: 09/13/20  9:08 AM   Specimen: Nasopharyngeal Swab  Result Value Ref Range   SARS Coronavirus 2 NEGATIVE NEGATIVE    Comment: (NOTE) SARS-CoV-2 target nucleic acids are NOT DETECTED.  The SARS-CoV-2 RNA is generally detectable in upper and lower respiratory specimens during the acute phase of infection. Negative results do not preclude SARS-CoV-2 infection, do not rule out co-infections with other pathogens, and should not be used as the sole basis for  treatment or other patient management decisions. Negative results must be combined with clinical observations, patient history, and epidemiological information. The expected result is Negative.  Fact Sheet for Patients: SugarRoll.be  Fact Sheet for Healthcare Providers: https://www.woods-mathews.com/  This test is not yet approved or cleared by the [place] FDA and  has been authorized for detection and/or diagnosis of SARS-CoV-2 by FDA under an Emergency Use Authorization (EUA). This EUA will remain  in  effect (meaning this test can be used) for the duration of the COVID-19 declaration under Se ction 564(b)(1) of the Act, 21 U.S.C. section 360bbb-3(b)(1), unless the authorization is terminated or revoked sooner.  Performed at [facility], [place] [address] [place], [place] [address]    No results found.  Review of Systems  Constitutional: Negative.   HENT: Negative.   Eyes: Negative.   Respiratory: Negative.   Cardiovascular: Negative.   Gastrointestinal: Negative.   Endocrine: Negative.   Genitourinary: Negative.   Musculoskeletal: Negative.   Skin: Negative.   Allergic/Immunologic: Negative.   Neurological: Negative.   Hematological: Negative.   Psychiatric/Behavioral: Negative.     Blood pressure (!) 173/77, temperature 97.7 F (36.5 C), temperature source Oral, resp. rate 16, height 5\' 6"  (1.676 m), weight 59 kg, SpO2 98 %. Physical Exam  GENERAL: The patient is AO x3, in no acute distress. HEENT: Head is normocephalic and atraumatic. EOMI are intact. Mouth is well hydrated and without lesions. NECK: Supple. No masses LUNGS: Clear to auscultation. No presence of rhonchi/wheezing/rales. Adequate chest expansion HEART: RRR, normal s1 and s2. ABDOMEN: Soft, nontender, no guarding, no peritoneal signs, and nondistended. BS +. No masses. EXTREMITIES: Without any cyanosis, clubbing, rash, lesions or edema. NEUROLOGIC: AOx3, no focal motor deficit. SKIN: no jaundice, no rashes  Assessment/Plan  64 y/o F with PMH melanoma s/p resection, coming for screening colonoscopy.  The patient is at average risk for colorectal cancer.  We will proceed with colonoscopy today.   Harvel Quale, MD 09/14/2020, 9:35 AM

## 2020-09-14 NOTE — Discharge Instructions (Signed)
Colonoscopy, Adult, Care After This sheet gives you information about how to care for yourself after your procedure. Your doctor may also give you more specific instructions. If you have problems or questions, call your doctor. What can I expect after the procedure? After the procedure, it is common to have:  A small amount of blood in your poop (stool) for 24 hours.  Some gas.  Mild cramping or bloating in your belly (abdomen). Follow these instructions at home: Eating and drinking   Drink enough fluid to keep your pee (urine) pale yellow.  Follow instructions from your doctor about what you cannot eat or drink.  Return to your normal diet as told by your doctor. Avoid heavy or fried foods that are hard to digest. Activity  Rest as told by your doctor.  Do not sit for a long time without moving. Get up to take short walks every 1-2 hours. This is important. Ask for help if you feel weak or unsteady.  Return to your normal activities as told by your doctor. Ask your doctor what activities are safe for you. To help cramping and bloating:   Try walking around.  Put heat on your belly as told by your doctor. Use the heat source that your doctor recommends, such as a moist heat pack or a heating pad. ? Put a towel between your skin and the heat source. ? Leave the heat on for 20-30 minutes. ? Remove the heat if your skin turns bright red. This is very important if you are unable to feel pain, heat, or cold. You may have a greater risk of getting burned. General instructions  For the first 24 hours after the procedure: ? Do not drive or use machinery. ? Do not sign important documents. ? Do not drink alcohol. ? Do your daily activities more slowly than normal. ? Eat foods that are soft and easy to digest.  Take over-the-counter or prescription medicines only as told by your doctor.  Keep all follow-up visits as told by your doctor. This is important. Contact a doctor  if:  You have blood in your poop 2-3 days after the procedure. Get help right away if:  You have more than a small amount of blood in your poop.  You see large clumps of tissue (blood clots) in your poop.  Your belly is swollen.  You feel like you may vomit (nauseous).  You vomit.  You have a fever.  You have belly pain that gets worse, and medicine does not help your pain. Summary  After the procedure, it is common to have a small amount of blood in your poop. You may also have mild cramping and bloating in your belly.  For the first 24 hours after the procedure, do not drive or use machinery, do not sign important documents, and do not drink alcohol.  Get help right away if you have a lot of blood in your poop, feel like you may vomit, have a fever, or have more belly pain. This information is not intended to replace advice given to you by your health care provider. Make sure you discuss any questions you have with your health care provider. Document Revised: 04/21/2019 Document Reviewed: 04/21/2019 Elsevier Patient Education  2020 [place] are being discharged to home.  Resume your previous diet.  Your physician has recommended a repeat colonoscopy in 10 years for screening purposes.

## 2020-09-14 NOTE — Anesthesia Postprocedure Evaluation (Signed)
Anesthesia Post Note  Patient: Sue Gutierrez  Procedure(s) Performed: COLONOSCOPY WITH PROPOFOL (N/A )  Patient location during evaluation: Endoscopy Anesthesia Type: General Level of consciousness: awake, oriented, awake and alert and patient cooperative Pain management: pain level controlled Vital Signs Assessment: post-procedure vital signs reviewed and stable Respiratory status: spontaneous breathing, respiratory function stable and nonlabored ventilation Cardiovascular status: blood pressure returned to baseline and stable Postop Assessment: no headache and no backache Anesthetic complications: no   No complications documented.   Last Vitals:  Vitals:   09/14/20 0917 09/14/20 1006  BP: (!) 173/77 116/64  Pulse:  61  Resp: 16 12  Temp: 36.5 C   SpO2: 98% 97%    Last Pain:  Vitals:   09/14/20 0917  TempSrc: Oral  PainSc: 0-No pain                 Tacy Learn

## 2020-09-14 NOTE — Op Note (Signed)
[facility] Patient Name: Sue Gutierrez Procedure Date: 09/14/2020 9:32 AM MRN: [id number] Date of Birth: 01-17-1956 Attending MD: Maylon Peppers ,  CSN: [id number] Age: 64 Admit Type: Outpatient Procedure:                Colonoscopy Indications:              Screening for colorectal malignant neoplasm Providers:                Maylon Peppers, Janeece Riggers, RN, Nelma Rothman,                            Technician Referring MD:              Medicines:                Monitored Anesthesia Care Complications:            No immediate complications. Estimated Blood Loss:     Estimated blood loss: none. Procedure:                Pre-Anesthesia Assessment:                           - Prior to the procedure, a History and Physical                            was performed, and patient medications, allergies                            and sensitivities were reviewed. The patient's                            tolerance of previous anesthesia was reviewed.                           - The risks and benefits of the procedure and the                            sedation options and risks were discussed with the                            patient. All questions were answered and informed                            consent was obtained.                           - ASA Grade Assessment: II - A patient with mild                            systemic disease.                           After obtaining informed consent, the colonoscope                            was passed under direct vision. Throughout the  procedure, the patient's blood pressure, pulse, and                            oxygen saturations were monitored continuously. The                            [id number]-HQ190L ([id number]) scope was introduced through                            the anus and advanced to the the cecum, identified                            by appendiceal orifice and ileocecal valve. The                             colonoscopy was performed without difficulty. The                            patient tolerated the procedure well. The quality                            of the bowel preparation was excellent. Scope                            withdrawal time was 12 minutes. Scope In: 9:43:47 AM Scope Out: 10:02:28 AM Scope Withdrawal Time: 0 hours 12 minutes 47 seconds  Total Procedure Duration: 0 hours 18 minutes 41 seconds  Findings:      The perianal and digital rectal examinations were normal.      The colon (entire examined portion) appeared normal.      The retroflexed view of the distal rectum and anal verge was normal and       showed no anal or rectal abnormalities. Impression:               - The entire examined colon is normal.                           - The distal rectum and anal verge are normal on                            retroflexion view.                           - No specimens collected. Moderate Sedation:      Per Anesthesia Care Recommendation:           - Discharge patient to home (ambulatory).                           - Resume previous diet.                           - Repeat colonoscopy in 10 years for screening                            purposes. Procedure Code(s):        ---  Professional ---                           U4114, GC, Colorectal cancer screening; colonoscopy                            on individual not meeting criteria for high risk Diagnosis Code(s):        --- Professional ---                           Z12.11, Encounter for screening for malignant                            neoplasm of colon CPT copyright 2019 American Medical Association. All rights reserved. The codes documented in this report are preliminary and upon coder review may  be revised to meet current compliance requirements. Maylon Peppers, MD Maylon Peppers,  09/14/2020 10:13:30 AM This report has been signed electronically. Number of Addenda: 0

## 2020-09-20 ENCOUNTER — Encounter (HOSPITAL_COMMUNITY): Payer: Self-pay | Admitting: Gastroenterology

## 2021-03-05 ENCOUNTER — Other Ambulatory Visit: Payer: Self-pay | Admitting: Obstetrics & Gynecology

## 2021-03-17 ENCOUNTER — Encounter: Payer: Self-pay | Admitting: Emergency Medicine

## 2021-03-17 ENCOUNTER — Ambulatory Visit
Admission: EM | Admit: 2021-03-17 | Discharge: 2021-03-17 | Disposition: A | Discharge status: Home / Self Care | Payer: Medicare PPO | Attending: Internal Medicine | Admitting: Internal Medicine

## 2021-03-17 ENCOUNTER — Other Ambulatory Visit: Payer: Self-pay

## 2021-03-17 DIAGNOSIS — J029 Acute pharyngitis, unspecified: Secondary | ICD-10-CM | POA: Diagnosis present

## 2021-03-17 LAB — POCT RAPID STREP A (OFFICE): Rapid Strep A Screen: NEGATIVE

## 2021-03-17 NOTE — ED Triage Notes (Signed)
Pt c/o sore throat onset this morning  Reports family members are being treated for tonsillitis/strep and she was with them and may have shared drinks from the same cup.   Denies f/v/n/d  A&O x4... NAD.Marland Kitchen. ambulatory

## 2021-03-17 NOTE — Discharge Instructions (Addendum)
Warm salt water gargle Your strep test is negative I will send the sample for culture We will call you with recommendations if labs are abnormal Increase oral fluid intake Please quarantine until COVID-19 test results are available Return to urgent care if symptoms worsen.

## 2021-03-18 LAB — NOVEL CORONAVIRUS, NAA: SARS-CoV-2, NAA: NOT DETECTED

## 2021-03-18 LAB — SARS-COV-2, NAA 2 DAY TAT

## 2021-03-20 LAB — CULTURE, GROUP A STREP (THRC)

## 2021-03-20 NOTE — ED Provider Notes (Signed)
[facility]    CSN: [id number] Arrival date & time: 03/17/21  1727      History   Chief Complaint Chief Complaint  Patient presents with   Sore Throat    HPI Sue Gutierrez is a 65 y.o. female comes to the urgent care with 1 day history of sore throat.  Patient has some nasal discharge/nasal congestion.  She denies any shortness of breath or wheezing.  No generalized body aches.  No ear pain.  Patient admits to having contact with some family members who was recently diagnosed with strep throat.  No fever or chills.  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   Melanoma in situ ([place]) 2004   Left leg   Palpitations 2012   Event recorder in 09/2011-multiple spells with palpitations and dyspnea, mostly with sinus rhythm but some with PACs or PVCs; normal TSH of 3.1 in 07/2011   Salivary gland tumor     Patient Active Problem List   Diagnosis Date Noted   Trigger point of shoulder region 04/28/2014   Trigger point of left shoulder region 03/17/2014   Trigger thumb 02/18/2014   Laboratory test 11/23/2011   Degenerative joint disease    Melanoma in situ [facility] [place])    Palpitations     Past Surgical History:  Procedure Laterality Date   CARPAL TUNNEL RELEASE     COLONOSCOPY WITH PROPOFOL N/A 09/14/2020   Procedure: COLONOSCOPY WITH PROPOFOL;  Surgeon: Harvel Quale, MD;  Location: [facility];  Service: Gastroenterology;  Laterality: N/A;  10:45   MELANOMA EXCISION     salivary gland tumor      OB History     Gravida  2   Para  2   Term      Preterm      AB      Living  2      SAB      IAB      Ectopic      Multiple      Live Births               Home Medications    Prior to Admission medications   Medication Sig Start Date End Date Taking? Authorizing Provider  acetaminophen (TYLENOL) 500 MG tablet Take 1,000 mg by mouth  every 6 (six) hours as needed (pain.).    [provider]  estradiol (ESTRACE) 2 MG tablet Take 1 tablet (2 mg total) by mouth daily. Patient not taking: Reported on 09/07/2020 02/19/20   Florian Buff, MD  naproxen sodium (ALEVE) 220 MG tablet Take 220 mg by mouth 2 (two) times daily as needed (pain.).    [provider]  Polyethyl Glycol-Propyl Glycol (SYSTANE) 0.4-0.3 % SOLN Place 1 drop into both eyes in the morning and at bedtime.    [provider]  PREMARIN vaginal cream INSERT ONE GRAM INTO THE VAGINA ONCE DAILY 03/08/21   Florian Buff, MD    Family History Family History  Problem Relation Age of Onset   Stroke Father    Cancer Brother    Colon cancer Neg Hx    Esophageal cancer Neg Hx    Stomach cancer Neg Hx     Social History Social History   Tobacco Use   Smoking status: Former    Pack years: 0.00   Smokeless  tobacco: Never  Vaping Use   Vaping Use: Never used  Substance Use Topics   Alcohol use: Yes    Alcohol/week: 1.0 standard drink    Types: 1 Standard drinks or equivalent per week    Comment: occasionally   Drug use: No     Allergies   Erythromycin, Penicillins, Septra [bactrim], and Sulfamethoxazole-trimethoprim   Review of Systems Review of Systems  HENT:  Positive for rhinorrhea and sore throat.   Respiratory: Negative.    Cardiovascular: Negative.   Musculoskeletal: Negative.     Physical Exam Triage Vital Signs ED Triage Vitals  Enc Vitals Group     BP 03/17/21 1833 (!) 145/82     Pulse Rate 03/17/21 1833 64     Resp 03/17/21 1833 16     Temp 03/17/21 1833 98.3 F (36.8 C)     Temp Source 03/17/21 1833 Oral     SpO2 03/17/21 1833 98 %     Weight --      Height --      Head Circumference --      Peak Flow --      Pain Score 03/17/21 1835 5     Pain Loc --      Pain Edu? --      Excl. in [place]? --    No data found.  Updated Vital Signs BP (!) 145/82 (BP Location: Right Arm)   Pulse 64   Temp 98.3 F  (36.8 C) (Oral)   Resp 16   SpO2 98%   Visual Acuity Right Eye Distance:   Left Eye Distance:   Bilateral Distance:    Right Eye Near:   Left Eye Near:    Bilateral Near:     Physical Exam Vitals and nursing note reviewed.  Constitutional:      General: She is not in acute distress.    Appearance: She is not ill-appearing.  HENT:     Right Ear: Tympanic membrane normal.     Left Ear: Tympanic membrane normal.     Mouth/Throat:     Mouth: Mucous membranes are moist.     Pharynx: No posterior oropharyngeal erythema.  Cardiovascular:     Rate and Rhythm: Normal rate and regular rhythm.  Pulmonary:     Effort: Pulmonary effort is normal.     Breath sounds: Normal breath sounds.  Neurological:     Mental Status: She is alert.     UC Treatments / Results  Labs (all labs ordered are listed, but only abnormal results are displayed) Labs Reviewed  CULTURE, GROUP A STREP [facility])  NOVEL CORONAVIRUS, NAA   Narrative:    Performed at:  [address], [place], [place]  [id number] Lab Director: Rush Farmer MD, Phone:  [phone]  SARS-COV-2, NAA 2 DAY TAT   Narrative:    Performed at:  [facility] [address] [place], [place]  [id number] Lab Director: Rush Farmer MD, Phone:  [phone]  POCT RAPID STREP A (OFFICE)    EKG   Radiology No results found.  Procedures Procedures (including critical care time)  Medications Ordered in UC Medications - No data to display  Initial Impression / Assessment and Plan / UC Course  I have reviewed the triage vital signs and the nursing notes.  Pertinent labs & imaging results that were available during my care of the patient were reviewed by me and considered in my medical decision making (see chart for details).  1.  Acute pharyngitis: Point-of-care strep test is negative Warm salt water gargle Throat cultures have been sent Increase oral fluid intake COVID-19 PCR test has  been sent We will call patient with recommendations if labs are abnormal. Return to urgent care if symptoms worsen. Final Clinical Impressions(s) / UC Diagnoses   Final diagnoses:  Acute pharyngitis, unspecified etiology     Discharge Instructions      Warm salt water gargle Your strep test is negative I will send the sample for culture We will call you with recommendations if labs are abnormal Increase oral fluid intake Please quarantine until COVID-19 test results are available Return to urgent care if symptoms worsen.   ED Prescriptions   None    PDMP not reviewed this encounter.   Chase Picket, MD 03/20/21 1128

## 2021-09-05 ENCOUNTER — Ambulatory Visit: Payer: Medicare PPO

## 2021-09-05 ENCOUNTER — Other Ambulatory Visit: Payer: Self-pay

## 2021-09-05 ENCOUNTER — Ambulatory Visit: Payer: Medicare PPO | Admitting: Orthopedic Surgery

## 2021-09-05 ENCOUNTER — Encounter: Payer: Self-pay | Admitting: Orthopedic Surgery

## 2021-09-05 VITALS — BP 150/85 | HR 69 | Ht 66.0 in | Wt 135.0 lb

## 2021-09-05 DIAGNOSIS — M25571 Pain in right ankle and joints of right foot: Secondary | ICD-10-CM

## 2021-09-05 DIAGNOSIS — M19071 Primary osteoarthritis, right ankle and foot: Secondary | ICD-10-CM

## 2021-09-05 DIAGNOSIS — G8929 Other chronic pain: Secondary | ICD-10-CM

## 2021-09-05 NOTE — Progress Notes (Signed)
Chief Complaint  Patient presents with   Ankle Pain    Right for entire life worse x 3 weeks with tennis    History this is a 65 year old female who enjoys tennis and plays 3-4 times a week.  I saw her in 2012 and at that time she had tendinitis and partial tearing of the posterior tibial tendon and peroneal tendons with ankle arthritis and I advised her to stop playing  However she has been able to manage continuing her tennis although at times she will have intermittent pain  3 to 4 weeks ago she started having more pain especially on the medial side and presents for evaluation and management wanting to continue doubles tennis.  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   Melanoma in situ [facility]) 2004   Left leg   Palpitations 2012   Event recorder in 09/2011-multiple spells with palpitations and dyspnea, mostly with sinus rhythm but some with PACs or PVCs; normal TSH of 3.1 in 07/2011   Salivary gland tumor    General exam in excellent shape well-developed well-nourished grooming hygiene normal ectomorphic body habitus  BP (!) 150/85   Pulse 69   Ht 5\' 6"  (1.676 m)   Wt 135 lb (61.2 kg)   BMI 21.79 kg/m   She is awake alert and oriented x3 mood and affect are normal  She walks with no discernible gait abnormality although on close inspection you can tell that she is not getting much dorsiflexion in the ankle.  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

## 2021-09-05 NOTE — Patient Instructions (Signed)
Anti-inflammatories with food as needed  Rest and wear the cam walker when symptomatic  Activity modifications as best you can  Stretching exercises

## 2021-09-10 ENCOUNTER — Telehealth: Payer: Medicare PPO | Admitting: Emergency Medicine

## 2021-09-10 DIAGNOSIS — Z20828 Contact with and (suspected) exposure to other viral communicable diseases: Secondary | ICD-10-CM

## 2021-09-10 DIAGNOSIS — R6889 Other general symptoms and signs: Secondary | ICD-10-CM

## 2021-09-10 MED ORDER — OSELTAMIVIR PHOSPHATE 75 MG PO CAPS: 75.0000 mg | ORAL_CAPSULE | Freq: Two times a day (BID) | ORAL | 0 refills | Status: AC

## 2021-09-10 MED ORDER — OSELTAMIVIR PHOSPHATE 75 MG PO CAPS: 75.0000 mg | ORAL_CAPSULE | Freq: Two times a day (BID) | ORAL | 0 refills | Status: DC

## 2021-09-10 NOTE — Progress Notes (Signed)

## 2021-09-10 NOTE — Addendum Note (Signed)
Addended by: Lestine Box on: 09/10/2021 03:23 PM   Modules accepted: Orders

## 2021-09-10 NOTE — Progress Notes (Signed)
I have spent 5 minutes in review of e-visit questionnaire, review and updating patient chart, medical decision making and response to patient.   Abdullah Rizzi, PA-C    

## 2021-11-25 ENCOUNTER — Telehealth: Payer: Medicare PPO | Admitting: Physician Assistant

## 2021-11-25 DIAGNOSIS — H109 Unspecified conjunctivitis: Secondary | ICD-10-CM | POA: Diagnosis not present

## 2021-11-25 MED ORDER — POLYMYXIN B-TRIMETHOPRIM 10000-0.1 UNIT/ML-% OP SOLN: 1.0000 [drp] | OPHTHALMIC | 0 refills | Status: DC

## 2021-11-25 NOTE — Progress Notes (Signed)

## 2022-01-23 DIAGNOSIS — E782 Mixed hyperlipidemia: Secondary | ICD-10-CM | POA: Diagnosis not present

## 2022-01-23 DIAGNOSIS — E559 Vitamin D deficiency, unspecified: Secondary | ICD-10-CM | POA: Diagnosis not present

## 2022-01-23 DIAGNOSIS — I10 Essential (primary) hypertension: Secondary | ICD-10-CM | POA: Diagnosis not present

## 2022-01-26 DIAGNOSIS — I10 Essential (primary) hypertension: Secondary | ICD-10-CM | POA: Diagnosis not present

## 2022-01-26 DIAGNOSIS — E559 Vitamin D deficiency, unspecified: Secondary | ICD-10-CM | POA: Diagnosis not present

## 2022-01-26 DIAGNOSIS — E782 Mixed hyperlipidemia: Secondary | ICD-10-CM | POA: Diagnosis not present

## 2022-03-10 DIAGNOSIS — E782 Mixed hyperlipidemia: Secondary | ICD-10-CM | POA: Diagnosis not present

## 2022-03-10 DIAGNOSIS — E559 Vitamin D deficiency, unspecified: Secondary | ICD-10-CM | POA: Diagnosis not present

## 2022-03-10 DIAGNOSIS — I10 Essential (primary) hypertension: Secondary | ICD-10-CM | POA: Diagnosis not present

## 2022-03-14 ENCOUNTER — Other Ambulatory Visit: Payer: Self-pay | Admitting: Obstetrics & Gynecology

## 2022-03-15 DIAGNOSIS — E559 Vitamin D deficiency, unspecified: Secondary | ICD-10-CM | POA: Diagnosis not present

## 2022-03-15 DIAGNOSIS — I10 Essential (primary) hypertension: Secondary | ICD-10-CM | POA: Diagnosis not present

## 2022-03-15 DIAGNOSIS — E782 Mixed hyperlipidemia: Secondary | ICD-10-CM | POA: Diagnosis not present

## 2022-04-18 ENCOUNTER — Ambulatory Visit
Admission: EM | Admit: 2022-04-18 | Discharge: 2022-04-18 | Disposition: A | Discharge status: Home / Self Care | Payer: Medicare PPO

## 2022-04-18 DIAGNOSIS — W540XXA Bitten by dog, initial encounter: Secondary | ICD-10-CM | POA: Diagnosis not present

## 2022-04-18 DIAGNOSIS — S41101A Unspecified open wound of right upper arm, initial encounter: Secondary | ICD-10-CM | POA: Diagnosis not present

## 2022-04-18 MED ORDER — MOXIFLOXACIN HCL 400 MG PO TABS: 400.0000 mg | ORAL_TABLET | Freq: Every day | ORAL | 0 refills | Status: AC

## 2022-04-18 NOTE — Discharge Instructions (Signed)
-   Please start on the moxifloxacin 400 mg daily to treat/prevent infection in the area on your arm for your dog bite you -Start Tylenol/ibuprofen, cool compresses, elevation of the extremity to help with pain/swelling/bruising -Return to urgent care if uou develop signs or symptoms of infection

## 2022-04-18 NOTE — ED Provider Notes (Signed)
[facility]    CSN: [id number] Arrival date & time: 04/18/22  1317      History   Chief Complaint Chief Complaint  Patient presents with   Animal Bite    HPI Sue Gutierrez is a 66 y.o. female.   Patient presents with dog bite to right arm that occurred about 30 minutes ago.  Reports she recently adopted an elderly small dog and is at home when her dogs got in a fight to be considerable pain.  Reports she was trying to separate her dogs and the elderly dog accidentally bit her and clipped down onto her arm.  Reports some bleeding, however is now controlled, open wound, and pain to the right forearm.  Denies numbness or tingling in her fingers, decreased range of motion, fevers, active bleeding.    Patient denies antibiotic use in the past month.  Reports her dog is fully vaccinated.    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   Melanoma in situ ([place]) 2004   Left leg   Palpitations 2012   Event recorder in 09/2011-multiple spells with palpitations and dyspnea, mostly with sinus rhythm but some with PACs or PVCs; normal TSH of 3.1 in 07/2011   Salivary gland tumor     Patient Active Problem List   Diagnosis Date Noted   Trigger point of shoulder region 04/28/2014   Trigger point of left shoulder region 03/17/2014   Trigger thumb 02/18/2014   Laboratory test 11/23/2011   Degenerative joint disease    Melanoma in situ [facility])    Palpitations     Past Surgical History:  Procedure Laterality Date   CARPAL TUNNEL RELEASE     COLONOSCOPY WITH PROPOFOL N/A 09/14/2020   Procedure: COLONOSCOPY WITH PROPOFOL;  Surgeon: Harvel Quale, MD;  [facility];  Service: Gastroenterology;  Laterality: N/A;  10:45   MELANOMA EXCISION     salivary gland tumor      OB History     Gravida  2   Para  2   Term      Preterm      AB      Living  2       SAB      IAB      Ectopic      Multiple      Live Births               Home Medications    Prior to Admission medications   Medication Sig Start Date End Date Taking? Authorizing Provider  moxifloxacin (AVELOX) 400 MG tablet Take 1 tablet (400 mg total) by mouth daily at 8 pm for 7 days. 04/18/22 04/25/22 Yes Eulogio Bear, NP  VITAMIN D PO Take by mouth.   Yes [provider]  acetaminophen (TYLENOL) 500 MG tablet Take 1,000 mg by mouth every 6 (six) hours as needed (pain.).    [provider]  cycloSPORINE (RESTASIS) 0.05 % ophthalmic emulsion  05/04/21   [provider]  naproxen sodium (ALEVE) 220 MG tablet Take 220 mg by mouth 2 (two) times daily as needed (pain.).    [provider]  PREMARIN vaginal cream INSERT ONE GRAM INTO THE VAGINA ONCE DAILY 03/14/22   Florian Buff, MD  rosuvastatin (CRESTOR) 5 MG tablet Take 5 mg by mouth at bedtime. 03/15/22  [provider]  trimethoprim-polymyxin b (POLYTRIM) ophthalmic solution Place 1 drop into the left eye every 4 (four) hours. 11/25/21   Mar Daring, PA-C  valsartan (DIOVAN) 160 MG tablet Take 160 mg by mouth daily. 03/15/22   [provider]    Family History Family History  Problem Relation Age of Onset   Stroke Father    Cancer Brother    Colon cancer Neg Hx    Esophageal cancer Neg Hx    Stomach cancer Neg Hx     Social History Social History   Tobacco Use   Smoking status: Former   Smokeless tobacco: Never  Scientific laboratory technician Use: Never used  Substance Use Topics   Alcohol use: Yes    Alcohol/week: 1.0 standard drink of alcohol    Types: 1 Standard drinks or equivalent per week    Comment: occasionally   Drug use: No     Allergies   Erythromycin, Penicillins, Septra [bactrim], and Sulfamethoxazole-trimethoprim   Review of Systems Review of Systems Per HPI  Physical Exam Triage Vital Signs ED Triage Vitals  Enc Vitals Group      BP 04/18/22 1333 (!) 143/82     Pulse Rate 04/18/22 1333 60     Resp 04/18/22 1333 16     Temp 04/18/22 1333 98 F (36.7 C)     Temp Source 04/18/22 1333 Oral     SpO2 04/18/22 1333 96 %     Weight --      Height --      Head Circumference --      Peak Flow --      Pain Score 04/18/22 1332 2     Pain Loc --      Pain Edu? --      Excl. in [place]? --    No data found.  Updated Vital Signs BP (!) 143/82 (BP Location: Right Arm)   Pulse 60   Temp 98 F (36.7 C) (Oral)   Resp 16   SpO2 96%   Visual Acuity Right Eye Distance:   Left Eye Distance:   Bilateral Distance:    Right Eye Near:   Left Eye Near:    Bilateral Near:     Physical Exam Vitals and nursing note reviewed.  Constitutional:      General: She is not in acute distress.    Appearance: Normal appearance. She is not toxic-appearing.  HENT:     Head: Normocephalic and atraumatic.     Mouth/Throat:     Mouth: Mucous membranes are moist.     Pharynx: Oropharynx is clear.  Pulmonary:     Effort: Pulmonary effort is normal. No respiratory distress.  Musculoskeletal:     Right forearm: Laceration present. No swelling, edema, tenderness or bony tenderness.     Left forearm: Normal.     Right wrist: Normal pulse.     Left wrist: Normal.     Right hand: Normal strength. Normal sensation. Normal capillary refill. Normal pulse.     Left hand: Normal.  Skin:    General: Skin is warm and dry.     Capillary Refill: Capillary refill takes less than 2 seconds.     Coloration: Skin is not jaundiced or pale.     Findings: Laceration present. No erythema.          Comments: 2 open lacerations to right forearm in area marked; there is no active bleeding.  Small amount of surrounding bruising appreciated.  No drainage, warmth, fluctuance.  Neurological:     Mental Status: She is alert and oriented to person, place, and time.  Psychiatric:        Behavior: Behavior is cooperative.      UC Treatments / Results   Labs (all labs ordered are listed, but only abnormal results are displayed) Labs Reviewed - No data to display  EKG   Radiology No results found.  Procedures Procedures (including critical care time)  Medications Ordered in UC Medications - No data to display  Initial Impression / Assessment and Plan / UC Course  I have reviewed the triage vital signs and the nursing notes.  Pertinent labs & imaging results that were available during my care of the patient were reviewed by me and considered in my medical decision making (see chart for details).    Patient is a very pleasant, well-appearing 66 year old female presenting for dog bite to right forearm today.  Given allergy to penicillin with rash, will avoid cephalosporin and treat with moxifloxacin 400 mg daily for 7 days.  Discussed side effects including C. difficile.  Discussed signs and symptoms of infection and return here if they develop or follow-up with primary care provider.  Encouraged ice, Tylenol/ibuprofen as needed for pain, elevation to help with pain and swelling.  The patient was given the opportunity to ask questions.  All questions answered to their satisfaction.  The patient is in agreement to this plan.   Final Clinical Impressions(s) / UC Diagnoses   Final diagnoses:  Dog bite, initial encounter     Discharge Instructions      - Please start on the moxifloxacin 400 mg daily to treat/prevent infection in the area on your arm for your dog bite you -Start Tylenol/ibuprofen, cool compresses, elevation of the extremity to help with pain/swelling/bruising -Return to urgent care if uou develop signs or symptoms of infection    ED Prescriptions     Medication Sig Dispense Auth. Provider   moxifloxacin (AVELOX) 400 MG tablet Take 1 tablet (400 mg total) by mouth daily at 8 pm for 7 days. 7 tablet Eulogio Bear, NP      PDMP not reviewed this encounter.   Eulogio Bear, NP 04/18/22 1404

## 2022-04-18 NOTE — ED Triage Notes (Signed)
Pt reports, her dog bite her in there right forearm 30 min ago.   Dog has all the immunizations up to date.

## 2022-06-06 DIAGNOSIS — I10 Essential (primary) hypertension: Secondary | ICD-10-CM | POA: Diagnosis not present

## 2022-06-06 DIAGNOSIS — E785 Hyperlipidemia, unspecified: Secondary | ICD-10-CM | POA: Diagnosis not present

## 2022-06-06 DIAGNOSIS — E559 Vitamin D deficiency, unspecified: Secondary | ICD-10-CM | POA: Diagnosis not present

## 2022-06-20 DIAGNOSIS — E559 Vitamin D deficiency, unspecified: Secondary | ICD-10-CM | POA: Diagnosis not present

## 2022-06-20 DIAGNOSIS — Z23 Encounter for immunization: Secondary | ICD-10-CM | POA: Diagnosis not present

## 2022-06-20 DIAGNOSIS — E782 Mixed hyperlipidemia: Secondary | ICD-10-CM | POA: Diagnosis not present

## 2022-06-20 DIAGNOSIS — I10 Essential (primary) hypertension: Secondary | ICD-10-CM | POA: Diagnosis not present

## 2022-06-20 DIAGNOSIS — N952 Postmenopausal atrophic vaginitis: Secondary | ICD-10-CM | POA: Diagnosis not present

## 2022-08-14 ENCOUNTER — Telehealth: Payer: Medicare PPO | Admitting: Family Medicine

## 2022-08-14 DIAGNOSIS — R112 Nausea with vomiting, unspecified: Secondary | ICD-10-CM | POA: Diagnosis not present

## 2022-08-14 MED ORDER — ONDANSETRON HCL 4 MG PO TABS: 4.0000 mg | ORAL_TABLET | Freq: Three times a day (TID) | ORAL | 0 refills | Status: DC | PRN

## 2022-08-14 NOTE — Progress Notes (Signed)
E-Visit for Nausea and Vomiting   We are sorry that you are not feeling well. Here is how we plan to help!  Based on what you have shared with me it looks like you have a Virus that is irritating your GI tract.  Vomiting is the forceful emptying of a portion of the stomach's content through the mouth.  Although nausea and vomiting can make you feel miserable, it's important to remember that these are not diseases, but rather symptoms of an underlying illness.  When we treat short term symptoms, we always caution that any symptoms that persist should be fully evaluated in a medical office.  I have prescribed a medication that will help alleviate your symptoms and allow you to stay hydrated:  Zofran 4 mg 1 tablet every 8 hours as needed for nausea and vomiting  HOME CARE: Drink clear liquids.  This is very important! Dehydration (the lack of fluid) can lead to a serious complication.  Start off with 1 tablespoon every 5 minutes for 8 hours. You may begin eating bland foods after 8 hours without vomiting.  Start with saltine crackers, white bread, rice, mashed potatoes, applesauce. After 48 hours on a bland diet, you may resume a normal diet. Try to go to sleep.  Sleep often empties the stomach and relieves the need to vomit.  GET HELP RIGHT AWAY IF:  Your symptoms do not improve or worsen within 2 days after treatment. You have a fever for over 3 days. You cannot keep down fluids after trying the medication.  MAKE SURE YOU:  Understand these instructions. Will watch your condition. Will get help right away if you are not doing well or get worse.    Thank you for choosing an e-visit.  Your e-visit answers were reviewed by a board certified advanced clinical practitioner to complete your personal care plan. Depending upon the condition, your plan could have included both over the counter or prescription medications.  Please review your pharmacy choice. Make sure the pharmacy is open so  you can pick up prescription now. If there is a problem, you may contact your provider through [employer] and have the prescription routed to another pharmacy.  Your safety is important to [place]. If you have drug allergies check your prescription carefully.   For the next 24 hours you can use MyChart to ask questions about today's visit, request a non-urgent call back, or ask for a work or school excuse. You will get an email in the next two days asking about your experience. I hope that your e-visit has been valuable and will speed your recovery.  I provided 5 minutes of non face-to-face time during this encounter for chart review, medication and order placement, as well as and documentation.

## 2022-08-18 ENCOUNTER — Ambulatory Visit: Payer: Self-pay

## 2022-08-18 ENCOUNTER — Ambulatory Visit
Admission: EM | Admit: 2022-08-18 | Discharge: 2022-08-18 | Disposition: A | Discharge status: Home / Self Care | Payer: Medicare PPO | Attending: Nurse Practitioner | Admitting: Nurse Practitioner

## 2022-08-18 DIAGNOSIS — J029 Acute pharyngitis, unspecified: Secondary | ICD-10-CM

## 2022-08-18 DIAGNOSIS — Z1152 Encounter for screening for COVID-19: Secondary | ICD-10-CM | POA: Diagnosis not present

## 2022-08-18 DIAGNOSIS — R0989 Other specified symptoms and signs involving the circulatory and respiratory systems: Secondary | ICD-10-CM | POA: Diagnosis not present

## 2022-08-18 DIAGNOSIS — J069 Acute upper respiratory infection, unspecified: Secondary | ICD-10-CM | POA: Diagnosis not present

## 2022-08-18 LAB — RESP PANEL BY RT-PCR (FLU A&B, COVID) ARPGX2
Influenza A by PCR: NEGATIVE
Influenza B by PCR: NEGATIVE
SARS Coronavirus 2 by RT PCR: NEGATIVE

## 2022-08-18 LAB — POCT RAPID STREP A (OFFICE): Rapid Strep A Screen: NEGATIVE

## 2022-08-18 MED ORDER — BENZONATATE 100 MG PO CAPS: 100.0000 mg | ORAL_CAPSULE | Freq: Three times a day (TID) | ORAL | 0 refills | Status: AC | PRN

## 2022-08-18 NOTE — Discharge Instructions (Addendum)
The rapid strep test is negative.  COVID/flu and throat culture are pending.  As discussed, if the COVID test is positive, you are a candidate to receive molnupiravir as an antiviral therapy.  You will be contacted if the pending test results are positive. Take medication as prescribed. Continue Tylenol for pain, fever, or general discomfort. Warm salt water gargles 3-4 times daily while throat symptoms persist. Increase fluids and allow for plenty of rest. Recommend eating a brat diet until your appetite improves.  This includes bananas, rice, applesauce, and toast.  You can also eat soup, broth, or yogurt If symptoms do not improve over the next 10 to 14 days, or suddenly worsen before that time, please follow-up in this clinic or with your primary care physician for further evaluation.

## 2022-08-18 NOTE — ED Provider Notes (Signed)
[facility]    CSN: [id number] Arrival date & time: 08/18/22  0802      History   Chief Complaint Chief Complaint  Patient presents with   Sore Throat    HPI Sue Gutierrez is a 66 y.o. female.   The history is provided by the patient.   Patient presents with a 1 day history of fever, cough, and sore throat.  She states that last evening she had a fever around 100- 101.  She currently denies headache, ear pain, nasal congestion, runny nose, wheezing, shortness of breath, difficulty breathing, nausea, vomiting, or diarrhea.  She states earlier this week she did have a "stomach bug" but her symptoms are improving.  She continues to report decreased appetite.  States that her grandchildren were sick with the same or similar symptoms earlier this week.  She states that she took Tylenol last evening around 12 PM.  Denies any known sick contacts.  Informs that she does have a history of seasonal allergies.  She states that she has received the COVID vaccines, with the exception of the most recent one.  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   Melanoma in situ ([place]) 2004   Left leg   Palpitations 2012   Event recorder in 09/2011-multiple spells with palpitations and dyspnea, mostly with sinus rhythm but some with PACs or PVCs; normal TSH of 3.1 in 07/2011   Salivary gland tumor     Patient Active Problem List   Diagnosis Date Noted   Trigger point of shoulder region 04/28/2014   Trigger point of left shoulder region 03/17/2014   Trigger thumb 02/18/2014   Laboratory test 11/23/2011   Degenerative joint disease    Melanoma in situ [facility])    Palpitations     Past Surgical History:  Procedure Laterality Date   CARPAL TUNNEL RELEASE     COLONOSCOPY WITH PROPOFOL N/A 09/14/2020   Procedure: COLONOSCOPY WITH PROPOFOL;  Surgeon: Harvel Quale, MD;  [facility];  Service: Gastroenterology;  Laterality: N/A;  10:45   MELANOMA EXCISION     salivary gland tumor      OB History     Gravida  2   Para  2   Term      Preterm      AB      Living  2      SAB      IAB      Ectopic      Multiple      Live Births               Home Medications    Prior to Admission medications   Medication Sig Start Date End Date Taking? Authorizing Provider  benzonatate (TESSALON PERLES) 100 MG capsule Take 1 capsule (100 mg total) by mouth 3 (three) times daily as needed for up to 10 days for cough. 08/18/22 08/28/22 Yes Saphia Vanderford-Warren, Alda Lea, NP  acetaminophen (TYLENOL) 500 MG tablet Take 1,000 mg by mouth every 6 (six) hours as needed (pain.).    [provider]  cycloSPORINE (RESTASIS) 0.05 % ophthalmic emulsion  05/04/21   [provider]  naproxen sodium (ALEVE) 220 MG tablet Take 220 mg by mouth 2 (two) times daily as needed (pain.).    [provider]  ondansetron (ZOFRAN) 4 MG tablet Take 1 tablet (  4 mg total) by mouth every 8 (eight) hours as needed for nausea or vomiting. 08/14/22   Perlie Mayo, NP  PREMARIN vaginal cream INSERT ONE GRAM INTO THE VAGINA ONCE DAILY 03/14/22   Florian Buff, MD  rosuvastatin (CRESTOR) 5 MG tablet Take 5 mg by mouth at bedtime. 03/15/22   [provider]  trimethoprim-polymyxin b (POLYTRIM) ophthalmic solution Place 1 drop into the left eye every 4 (four) hours. 11/25/21   Mar Daring, PA-C  valsartan (DIOVAN) 160 MG tablet Take 160 mg by mouth daily. 03/15/22   [provider]  VITAMIN D PO Take by mouth.    [provider]    Family History Family History  Problem Relation Age of Onset   Stroke Father    Cancer Brother    Colon cancer Neg Hx    Esophageal cancer Neg Hx    Stomach cancer Neg Hx     Social History Social History   Tobacco Use   Smoking status: Former   Smokeless tobacco: Never  Scientific laboratory technician Use:  Never used  Substance Use Topics   Alcohol use: Yes    Alcohol/week: 1.0 standard drink of alcohol    Types: 1 Standard drinks or equivalent per week    Comment: occasionally   Drug use: No     Allergies   Erythromycin, Penicillins, Septra [bactrim], and Sulfamethoxazole-trimethoprim   Review of Systems Review of Systems Per HPI  Physical Exam Triage Vital Signs ED Triage Vitals  Enc Vitals Group     BP 08/18/22 0809 (!) 148/82     Pulse Rate 08/18/22 0809 74     Resp 08/18/22 0809 16     Temp 08/18/22 0809 99 F (37.2 C)     Temp Source 08/18/22 0809 Oral     SpO2 08/18/22 0809 98 %     Weight --      Height --      Head Circumference --      Peak Flow --      Pain Score 08/18/22 0810 0     Pain Loc --      Pain Edu? --      Excl. in [place]? --    No data found.  Updated Vital Signs BP (!) 148/82 (BP Location: Right Arm)   Pulse 74   Temp 99 F (37.2 C) (Oral)   Resp 16   SpO2 98%   Visual Acuity Right Eye Distance:   Left Eye Distance:   Bilateral Distance:    Right Eye Near:   Left Eye Near:    Bilateral Near:     Physical Exam Vitals and nursing note reviewed.  Constitutional:      General: She is not in acute distress.    Appearance: She is well-developed.  HENT:     Head: Normocephalic and atraumatic.     Right Ear: Tympanic membrane and ear canal normal.     Left Ear: Tympanic membrane and ear canal normal.     Nose: No congestion.     Right Turbinates: Enlarged and swollen.     Left Turbinates: Enlarged and swollen.     Right Sinus: No maxillary sinus tenderness or frontal sinus tenderness.     Left Sinus: No maxillary sinus tenderness or frontal sinus tenderness.     Mouth/Throat:     Lips: Pink.     Mouth: Mucous membranes are moist.     Pharynx: Uvula midline. Posterior oropharyngeal  erythema present. No pharyngeal swelling, oropharyngeal exudate or uvula swelling.     Tonsils: No tonsillar exudate.  Eyes:     Conjunctiva/sclera:  Conjunctivae normal.     Pupils: Pupils are equal, round, and reactive to light.  Neck:     Thyroid: No thyromegaly.     Trachea: No tracheal deviation.  Cardiovascular:     Rate and Rhythm: Normal rate and regular rhythm.     Heart sounds: Normal heart sounds.  Pulmonary:     Effort: Pulmonary effort is normal.     Breath sounds: Normal breath sounds.  Abdominal:     General: Bowel sounds are normal. There is no distension.     Palpations: Abdomen is soft.     Tenderness: There is no abdominal tenderness.  Musculoskeletal:     Cervical back: Normal range of motion and neck supple.  Skin:    General: Skin is warm and dry.  Neurological:     General: No focal deficit present.     Mental Status: She is alert and oriented to person, place, and time.  Psychiatric:        Mood and Affect: Mood normal.        Behavior: Behavior normal.        Thought Content: Thought content normal.        Judgment: Judgment normal.      UC Treatments / Results  Labs (all labs ordered are listed, but only abnormal results are displayed) Labs Reviewed  RESP PANEL BY RT-PCR (FLU A&B, COVID) ARPGX2  CULTURE, GROUP A STREP [facility])  POCT RAPID STREP A (OFFICE)    EKG   Radiology No results found.  Procedures Procedures (including critical care time)  Medications Ordered in UC Medications - No data to display  Initial Impression / Assessment and Plan / UC Course  I have reviewed the triage vital signs and the nursing notes.  Pertinent labs & imaging results that were available during my care of the patient were reviewed by me and considered in my medical decision making (see chart for details).  Symptoms some of a respiratory infection Viral upper respiratory tract infection with cough Sore throat  Rapid strep test is negative, COVID/flu and throat culture are pending at this time.  Symptoms are consistent with a viral infection based on her current presentation.  Vital signs are  stable, she is in no acute distress.  Recommendations to continue Tylenol.  With supportive care recommendations to increase fluids, allow for plenty of rest, warm saltwater gargles and dietary modification until appetite improves.  Patient is a candidate to receive antiviral therapy if the COVID test is positive.  Recommendations for molnupiravir as there is no recent lab work on her chart.  Patient verbalizes understanding, with return precautions discussed.  All questions were answered.  Patient is stable for discharge. Final Clinical Impressions(s) / UC Diagnoses   Final diagnoses:  Symptoms of upper respiratory infection (URI)  Encounter for screening for COVID-19  Viral upper respiratory tract infection with cough  Sore throat     Discharge Instructions      The rapid strep test is negative.  COVID/flu and throat culture are pending.  As discussed, if the COVID test is positive, you are a candidate to receive molnupiravir as an antiviral therapy.  You will be contacted if the pending test results are positive. Take medication as prescribed. Continue Tylenol for pain, fever, or general discomfort. Warm salt water gargles 3-4 times daily while throat  symptoms persist. Increase fluids and allow for plenty of rest. Recommend eating a brat diet until your appetite improves.  This includes bananas, rice, applesauce, and toast.  You can also eat soup, broth, or yogurt If symptoms do not improve over the next 10 to 14 days, or suddenly worsen before that time, please follow-up in this clinic or with your primary care physician for further evaluation.     ED Prescriptions     Medication Sig Dispense Auth. Provider   benzonatate (TESSALON PERLES) 100 MG capsule Take 1 capsule (100 mg total) by mouth 3 (three) times daily as needed for up to 10 days for cough. 30 capsule Kristianne Albin-Warren, Alda Lea, NP      PDMP not reviewed this encounter.   Tish Men, NP 08/18/22 [phone]

## 2022-08-18 NOTE — ED Triage Notes (Signed)
Pt states sore throat, and cough since yesterday.

## 2022-08-21 LAB — CULTURE, GROUP A STREP (THRC)

## 2022-09-15 DIAGNOSIS — E559 Vitamin D deficiency, unspecified: Secondary | ICD-10-CM | POA: Diagnosis not present

## 2022-09-15 DIAGNOSIS — E785 Hyperlipidemia, unspecified: Secondary | ICD-10-CM | POA: Diagnosis not present

## 2022-09-15 DIAGNOSIS — I10 Essential (primary) hypertension: Secondary | ICD-10-CM | POA: Diagnosis not present

## 2022-09-20 DIAGNOSIS — E782 Mixed hyperlipidemia: Secondary | ICD-10-CM | POA: Diagnosis not present

## 2022-09-20 DIAGNOSIS — Z23 Encounter for immunization: Secondary | ICD-10-CM | POA: Diagnosis not present

## 2022-09-20 DIAGNOSIS — I10 Essential (primary) hypertension: Secondary | ICD-10-CM | POA: Diagnosis not present

## 2022-09-20 DIAGNOSIS — E559 Vitamin D deficiency, unspecified: Secondary | ICD-10-CM | POA: Diagnosis not present

## 2022-10-04 ENCOUNTER — Telehealth: Payer: Medicare PPO | Admitting: Physician Assistant

## 2022-10-04 DIAGNOSIS — R6889 Other general symptoms and signs: Secondary | ICD-10-CM | POA: Diagnosis not present

## 2022-10-04 MED ORDER — OSELTAMIVIR PHOSPHATE 75 MG PO CAPS: 75.0000 mg | ORAL_CAPSULE | Freq: Two times a day (BID) | ORAL | 0 refills | Status: DC

## 2022-10-04 NOTE — Progress Notes (Signed)
E visit for Flu like symptoms   We are sorry that you are not feeling well.  Here is how we plan to help! Based on what you have shared with me it looks like you may have a respiratory virus that may be influenza.  Influenza or "the flu" is   an infection caused by a respiratory virus. The flu virus is highly contagious and persons who did not receive their yearly flu vaccination may "catch" the flu from close contact.  We have anti-viral medications to treat the viruses that cause this infection. They are not a "cure" and only shorten the course of the infection. These prescriptions are most effective when they are given within the first 2 days of "flu" symptoms. Antiviral medication are indicated if you have a high risk of complications from the flu. You should  also consider an antiviral medication if you are in close contact with someone who is at risk. These medications can help patients avoid complications from the flu  but have side effects that you should know. Possible side effects from Tamiflu or oseltamivir include nausea, vomiting, diarrhea, dizziness, headaches, eye redness, sleep problems or other respiratory symptoms. You should not take Tamiflu if you have an allergy to oseltamivir or any to the ingredients in Tamiflu.  Based upon your symptoms and potential risk factors I have prescribed Oseltamivir (Tamiflu).  It has been sent to your designated pharmacy.  You will take one 75 mg capsule orally twice a day for the next 5 days.  ANYONE WHO HAS FLU SYMPTOMS SHOULD: Stay home. The flu is highly contagious and going out or to work exposes others! Be sure to drink plenty of fluids. Water is fine as well as fruit juices, sodas and electrolyte beverages. You may want to stay away from caffeine or alcohol. If you are nauseated, try taking small sips of liquids. How do you know if you are getting enough fluid? Your urine should be a pale yellow or almost colorless. Get rest. Taking a steamy  shower or using a humidifier may help nasal congestion and ease sore throat pain. Using a saline nasal spray works much the same way. Cough drops, hard candies and sore throat lozenges may ease your cough. Line up a caregiver. Have someone check on you regularly.   GET HELP RIGHT AWAY IF: You cannot keep down liquids or your medications. You become short of breath Your fell like you are going to pass out or loose consciousness. Your symptoms persist after you have completed your treatment plan MAKE SURE YOU  Understand these instructions. Will watch your condition. Will get help right away if you are not doing well or get worse.  Your e-visit answers were reviewed by a board certified advanced clinical practitioner to complete your personal care plan.  Depending on the condition, your plan could have included both over the counter or prescription medications.  If there is a problem please reply  once you have received a response from your provider.  Your safety is important to us.  If you have drug allergies check your prescription carefully.    You can use MyChart to ask questions about today's visit, request a non-urgent call back, or ask for a work or school excuse for 24 hours related to this e-Visit. If it has been greater than 24 hours you will need to follow up with your provider, or enter a new e-Visit to address those concerns.  You will get an e-mail in the next   two days asking about your experience.  I hope that your e-visit has been valuable and will speed your recovery. Thank you for using e-visits.  I have spent 5 minutes in review of e-visit questionnaire, review and updating patient chart, medical decision making and response to patient.   Ashley Montminy M Hoa Briggs, PA-C  

## 2022-10-10 ENCOUNTER — Other Ambulatory Visit: Payer: Self-pay | Admitting: Family Medicine

## 2022-10-10 ENCOUNTER — Ambulatory Visit
Admission: RE | Admit: 2022-10-10 | Discharge: 2022-10-10 | Disposition: A | Discharge status: Home / Self Care | Payer: Medicare PPO | Source: Ambulatory Visit | Attending: Family Medicine | Admitting: Family Medicine

## 2022-10-10 DIAGNOSIS — R059 Cough, unspecified: Secondary | ICD-10-CM

## 2022-10-10 DIAGNOSIS — R0602 Shortness of breath: Secondary | ICD-10-CM

## 2022-10-10 DIAGNOSIS — J069 Acute upper respiratory infection, unspecified: Secondary | ICD-10-CM | POA: Diagnosis not present

## 2022-10-10 DIAGNOSIS — R062 Wheezing: Secondary | ICD-10-CM | POA: Diagnosis not present

## 2022-11-13 DIAGNOSIS — Z1339 Encounter for screening examination for other mental health and behavioral disorders: Secondary | ICD-10-CM | POA: Diagnosis not present

## 2022-11-13 DIAGNOSIS — Z0000 Encounter for general adult medical examination without abnormal findings: Secondary | ICD-10-CM | POA: Diagnosis not present

## 2022-11-13 DIAGNOSIS — Z1331 Encounter for screening for depression: Secondary | ICD-10-CM | POA: Diagnosis not present

## 2022-11-13 DIAGNOSIS — I10 Essential (primary) hypertension: Secondary | ICD-10-CM | POA: Diagnosis not present

## 2022-11-16 ENCOUNTER — Other Ambulatory Visit (HOSPITAL_COMMUNITY): Payer: Self-pay | Admitting: Nurse Practitioner

## 2022-11-16 DIAGNOSIS — Z1231 Encounter for screening mammogram for malignant neoplasm of breast: Secondary | ICD-10-CM

## 2022-11-16 DIAGNOSIS — E559 Vitamin D deficiency, unspecified: Secondary | ICD-10-CM

## 2022-12-06 ENCOUNTER — Encounter (HOSPITAL_COMMUNITY): Payer: Self-pay | Admitting: Nurse Practitioner

## 2022-12-07 ENCOUNTER — Encounter: Payer: Self-pay | Admitting: Radiology

## 2022-12-18 ENCOUNTER — Ambulatory Visit (HOSPITAL_COMMUNITY)
Admission: RE | Admit: 2022-12-18 | Discharge: 2022-12-18 | Disposition: A | Discharge status: Home / Self Care | Payer: Medicare PPO | Source: Ambulatory Visit | Attending: Nurse Practitioner | Admitting: Nurse Practitioner

## 2022-12-18 DIAGNOSIS — M85851 Other specified disorders of bone density and structure, right thigh: Secondary | ICD-10-CM | POA: Diagnosis not present

## 2022-12-18 DIAGNOSIS — Z780 Asymptomatic menopausal state: Secondary | ICD-10-CM | POA: Insufficient documentation

## 2022-12-18 DIAGNOSIS — Z13820 Encounter for screening for osteoporosis: Secondary | ICD-10-CM | POA: Insufficient documentation

## 2022-12-18 DIAGNOSIS — Z1231 Encounter for screening mammogram for malignant neoplasm of breast: Secondary | ICD-10-CM

## 2022-12-18 DIAGNOSIS — E559 Vitamin D deficiency, unspecified: Secondary | ICD-10-CM | POA: Insufficient documentation

## 2023-01-19 DIAGNOSIS — E559 Vitamin D deficiency, unspecified: Secondary | ICD-10-CM | POA: Diagnosis not present

## 2023-01-19 DIAGNOSIS — E782 Mixed hyperlipidemia: Secondary | ICD-10-CM | POA: Diagnosis not present

## 2023-01-24 DIAGNOSIS — E673 Hypervitaminosis D: Secondary | ICD-10-CM | POA: Diagnosis not present

## 2023-01-24 DIAGNOSIS — I10 Essential (primary) hypertension: Secondary | ICD-10-CM | POA: Diagnosis not present

## 2023-01-24 DIAGNOSIS — M8580 Other specified disorders of bone density and structure, unspecified site: Secondary | ICD-10-CM | POA: Diagnosis not present

## 2023-01-24 DIAGNOSIS — E782 Mixed hyperlipidemia: Secondary | ICD-10-CM | POA: Diagnosis not present

## 2023-01-24 DIAGNOSIS — E559 Vitamin D deficiency, unspecified: Secondary | ICD-10-CM | POA: Diagnosis not present

## 2023-02-16 ENCOUNTER — Telehealth: Payer: Self-pay | Admitting: Orthopedic Surgery

## 2023-02-16 NOTE — Telephone Encounter (Signed)
Patient called and left voicemail she is wanting to go for Physical Therapy in [place].   If you would call her back at [phone]

## 2023-02-19 NOTE — Telephone Encounter (Signed)
The patient lvm late Friday, I returned the call this morning and lvm.  I did explain that she hasn't been seen since 2022 and will probably need to come in and be seen to discuss with Dr. Romeo Apple.

## 2023-02-22 ENCOUNTER — Encounter: Payer: Self-pay | Admitting: Orthopedic Surgery

## 2023-02-22 ENCOUNTER — Ambulatory Visit: Payer: Medicare PPO | Admitting: Orthopedic Surgery

## 2023-02-22 ENCOUNTER — Other Ambulatory Visit (INDEPENDENT_AMBULATORY_CARE_PROVIDER_SITE_OTHER): Payer: Medicare PPO

## 2023-02-22 VITALS — Ht 66.0 in | Wt 135.0 lb

## 2023-02-22 DIAGNOSIS — M25571 Pain in right ankle and joints of right foot: Secondary | ICD-10-CM

## 2023-02-22 DIAGNOSIS — G8929 Other chronic pain: Secondary | ICD-10-CM

## 2023-02-22 NOTE — Patient Instructions (Signed)
You will get a call from [facility] it will be several weeks it may be quicker if you call them to schedule [phone] is the phone number to call

## 2023-02-22 NOTE — Progress Notes (Signed)
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  Still an avid tennis player  She actually made it through approximately 75 to 80% of her tennis season  The patient has intermittent symptoms seems to come and go seems to be activity related she was last seen in November 2022  X-rays were obtained today and clinical exam was performed  The clinical examination reveals the patient has very limited ankle dorsiflexion plantarflexion with a total arc of 10 degrees subtalar joint motion is still fairly preserved  She has pain and tenderness around the entire ankle joint it is global and circular primarily medially but also laterally  67 years old worsening arthritis of the right ankle  We both agree at this time it is prudent to send her to a foot and ankle specialist to have them weigh in on whether or not she needs a replacement or fusion.  It is important for her to continue to play tennis  Worsening arthritis of the right ankle  The patient would prefer to see Dr. Victorino Dike

## 2023-04-11 DIAGNOSIS — M25571 Pain in right ankle and joints of right foot: Secondary | ICD-10-CM | POA: Diagnosis not present

## 2023-06-05 DIAGNOSIS — K219 Gastro-esophageal reflux disease without esophagitis: Secondary | ICD-10-CM | POA: Diagnosis not present

## 2023-06-05 DIAGNOSIS — I10 Essential (primary) hypertension: Secondary | ICD-10-CM | POA: Diagnosis not present

## 2023-06-23 ENCOUNTER — Telehealth: Payer: Medicare PPO | Admitting: Family Medicine

## 2023-06-23 DIAGNOSIS — B349 Viral infection, unspecified: Secondary | ICD-10-CM | POA: Diagnosis not present

## 2023-06-23 NOTE — Progress Notes (Signed)
E-Visit for Sore Throat  We are sorry that you are not feeling well.  Here is how we plan to help!  Your symptoms indicate a likely viral infection (Pharyngitis).   Pharyngitis is inflammation in the back of the throat which can cause a sore throat, scratchiness and sometimes difficulty swallowing.   Pharyngitis is typically caused by a respiratory virus and will just run its course.  Please keep in mind that your symptoms could last up to 10 days.  For throat pain, we recommend over the counter oral pain relief medications such as acetaminophen or aspirin, or anti-inflammatory medications such as ibuprofen or naproxen sodium.  Topical treatments such as oral throat lozenges or sprays may be used as needed.  Avoid close contact with loved ones, especially the very young and elderly.  Remember to wash your hands thoroughly throughout the day as this is the number one way to prevent the spread of infection and wipe down door knobs and counters with disinfectant.  After careful review of your answers, I would not recommend an antibiotic for your condition.  Antibiotics should not be used to treat conditions that we suspect are caused by viruses like the virus that causes the common cold or flu. However, some people can have Strep with atypical symptoms. You may need formal testing in clinic or office to confirm if your symptoms continue or worsen.  Providers prescribe antibiotics to treat infections caused by bacteria. Antibiotics are very powerful in treating bacterial infections when they are used properly.  To maintain their effectiveness, they should be used only when necessary.  Overuse of antibiotics has resulted in the development of super bugs that are resistant to treatment!    I would recommend you do an in home covid test. Thanks Brynnly Bonet   Home Care: Only take medications as instructed by your medical team. Do not drink alcohol while taking these medications. A steam or ultrasonic humidifier  can help congestion.  You can place a towel over your head and breathe in the steam from hot water coming from a faucet. Avoid close contacts especially the very young and the elderly. Cover your mouth when you cough or sneeze. Always remember to wash your hands.  Get Help Right Away If: You develop worsening fever or throat pain. You develop a severe head ache or visual changes. Your symptoms persist after you have completed your treatment plan.  Make sure you Understand these instructions. Will watch your condition. Will get help right away if you are not doing well or get worse.   Thank you for choosing an e-visit.  Your e-visit answers were reviewed by a board certified advanced clinical practitioner to complete your personal care plan. Depending upon the condition, your plan could have included both over the counter or prescription medications.  Please review your pharmacy choice. Make sure the pharmacy is open so you can pick up prescription now. If there is a problem, you may contact your provider through [employer] and have the prescription routed to another pharmacy.  Your safety is important to [place]. If you have drug allergies check your prescription carefully.   For the next 24 hours you can use MyChart to ask questions about today's visit, request a non-urgent call back, or ask for a work or school excuse. You will get an email in the next two days asking about your experience. I hope that your e-visit has been valuable and will speed your recovery.   have provided 5 minutes of  non face to face time during this encounter for chart review and documentation.

## 2023-06-24 ENCOUNTER — Ambulatory Visit
Admission: EM | Admit: 2023-06-24 | Discharge: 2023-06-24 | Disposition: A | Discharge status: Home / Self Care | Payer: Medicare PPO | Attending: Nurse Practitioner | Admitting: Nurse Practitioner

## 2023-06-24 DIAGNOSIS — Z1152 Encounter for screening for COVID-19: Secondary | ICD-10-CM | POA: Insufficient documentation

## 2023-06-24 DIAGNOSIS — B349 Viral infection, unspecified: Secondary | ICD-10-CM | POA: Insufficient documentation

## 2023-06-24 DIAGNOSIS — J029 Acute pharyngitis, unspecified: Secondary | ICD-10-CM | POA: Diagnosis not present

## 2023-06-24 LAB — POCT RAPID STREP A (OFFICE): Rapid Strep A Screen: NEGATIVE

## 2023-06-24 MED ORDER — FLUTICASONE PROPIONATE 50 MCG/ACT NA SUSP: 2.0000 | Freq: Every day | NASAL | 0 refills | Status: DC

## 2023-06-24 MED ORDER — LIDOCAINE VISCOUS HCL 2 % MT SOLN: OROMUCOSAL | 0 refills | Status: DC

## 2023-06-24 NOTE — ED Provider Notes (Signed)
[facility]    CSN: [id number] Arrival date & time: 06/24/23  1004      History   Chief Complaint No chief complaint on file.   HPI Sue Gutierrez is a 67 y.o. female.   The history is provided by the patient.   Patient presents for complaints of fever, chills, sore throat, and headache that has been present for the past 2 days.  Patient states sore throat started over the past 24 hours.  She also complains of a "mild" cough and some nasal congestion.  She denies ear pain, ear drainage, wheezing, difficulty breathing, chest pain, abdominal pain, nausea, vomiting, or diarrhea.  She reports she has had decreased appetite.  She has been taking Tylenol for her symptoms.  She denies any obvious known sick contacts.  Patient reports that she took a home COVID test that was negative, but she would like for [place] to repeat her COVID test today.  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   Melanoma in situ (HCC) 2004   Left leg   Palpitations 2012   Event recorder in 09/2011-multiple spells with palpitations and dyspnea, mostly with sinus rhythm but some with PACs or PVCs; normal TSH of 3.1 in 07/2011   Salivary gland tumor     Patient Active Problem List   Diagnosis Date Noted   Trigger point of shoulder region 04/28/2014   Trigger point of left shoulder region 03/17/2014   Trigger thumb 02/18/2014   Laboratory test 11/23/2011   Degenerative joint disease    Melanoma in situ [facility])    Palpitations     Past Surgical History:  Procedure Laterality Date   CARPAL TUNNEL RELEASE     COLONOSCOPY WITH PROPOFOL N/A 09/14/2020   Procedure: COLONOSCOPY WITH PROPOFOL;  Surgeon: Dolores Frame, MD;  Location: [facility];  Service: Gastroenterology;  Laterality: N/A;  10:45   MELANOMA EXCISION     salivary gland tumor      OB History     Gravida  2   Para  2   Term       Preterm      AB      Living  2      SAB      IAB      Ectopic      Multiple      Live Births               Home Medications    Prior to Admission medications   Medication Sig Start Date End Date Taking? Authorizing Provider  fluticasone (FLONASE) 50 MCG/ACT nasal spray Place 2 sprays into both nostrils daily. 06/24/23  Yes Mickael Mcnutt-Warren, Sadie Haber, NP  lidocaine (XYLOCAINE) 2 % solution Gargle and spit 5 mL every 6 hours as needed for throat pain or discomfort. 06/24/23  Yes Parsa Rickett-Warren, Sadie Haber, NP  acetaminophen (TYLENOL) 500 MG tablet Take 1,000 mg by mouth every 6 (six) hours as needed (pain.).    [provider]  cycloSPORINE (RESTASIS) 0.05 % ophthalmic emulsion  05/04/21   [provider]  naproxen sodium (ALEVE) 220 MG tablet Take 220 mg by mouth 2 (two) times daily as needed (pain.).    [provider]  PREMARIN vaginal cream INSERT ONE GRAM INTO THE VAGINA ONCE DAILY 03/14/22   Lazaro Arms, MD  rosuvastatin (CRESTOR) 5 MG  tablet Take 5 mg by mouth at bedtime. 03/15/22   [provider]  valsartan (DIOVAN) 160 MG tablet Take 160 mg by mouth daily. 03/15/22   [provider]  VITAMIN D PO Take by mouth.    [provider]    Family History Family History  Problem Relation Age of Onset   Stroke Father    Cancer Brother    Colon cancer Neg Hx    Esophageal cancer Neg Hx    Stomach cancer Neg Hx     Social History Social History   Tobacco Use   Smoking status: Former   Smokeless tobacco: Never  Vaping Use   Vaping status: Never Used  Substance Use Topics   Alcohol use: Yes    Alcohol/week: 1.0 standard drink of alcohol    Types: 1 Standard drinks or equivalent per week    Comment: occasionally   Drug use: No     Allergies   Erythromycin, Penicillins, Septra [bactrim], and Sulfamethoxazole-trimethoprim   Review of Systems Review of Systems Per HPI  Physical Exam Triage Vital Signs ED  Triage Vitals  Encounter Vitals Group     BP 06/24/23 1156 (!) 171/90     Systolic BP Percentile --      Diastolic BP Percentile --      Pulse Rate 06/24/23 1156 75     Resp 06/24/23 1156 18     Temp 06/24/23 1156 98.9 F (37.2 C)     Temp Source 06/24/23 1156 Oral     SpO2 06/24/23 1156 95 %     Weight --      Height --      Head Circumference --      Peak Flow --      Pain Score 06/24/23 1157 8     Pain Loc --      Pain Education --      Exclude from Growth Chart --    No data found.  Updated Vital Signs BP (!) 171/90 (BP Location: Right Arm)   Pulse 75   Temp 98.9 F (37.2 C) (Oral)   Resp 18   SpO2 95%   Visual Acuity Right Eye Distance:   Left Eye Distance:   Bilateral Distance:    Right Eye Near:   Left Eye Near:    Bilateral Near:     Physical Exam Vitals and nursing note reviewed.  Constitutional:      General: She is not in acute distress.    Appearance: Normal appearance.  HENT:     Head: Normocephalic.     Right Ear: Tympanic membrane, ear canal and external ear normal.     Left Ear: Tympanic membrane, ear canal and external ear normal.     Nose: Congestion present.     Right Turbinates: Enlarged and swollen.     Left Turbinates: Enlarged and swollen.     Right Sinus: No maxillary sinus tenderness or frontal sinus tenderness.     Left Sinus: No maxillary sinus tenderness or frontal sinus tenderness.     Mouth/Throat:     Lips: Pink.     Mouth: Mucous membranes are moist.     Pharynx: Uvula midline. Posterior oropharyngeal erythema and postnasal drip present. No pharyngeal swelling, oropharyngeal exudate or uvula swelling.     Tonsils: 1+ on the right. 1+ on the left.     Comments: Cobblestoning present to posterior oropharynx Eyes:     Extraocular Movements: Extraocular movements intact.  Conjunctiva/sclera: Conjunctivae normal.     Pupils: Pupils are equal, round, and reactive to light.  Cardiovascular:     Rate and Rhythm: Normal rate  and regular rhythm.     Pulses: Normal pulses.     Heart sounds: Normal heart sounds.  Pulmonary:     Effort: Pulmonary effort is normal. No respiratory distress.     Breath sounds: Normal breath sounds. No stridor. No wheezing, rhonchi or rales.  Abdominal:     General: Bowel sounds are normal.     Palpations: Abdomen is soft.     Tenderness: There is no abdominal tenderness.  Musculoskeletal:     Cervical back: Normal range of motion.  Lymphadenopathy:     Cervical: No cervical adenopathy.  Skin:    General: Skin is warm and dry.  Neurological:     General: No focal deficit present.     Mental Status: She is alert and oriented to person, place, and time.  Psychiatric:        Mood and Affect: Mood normal.        Behavior: Behavior normal.      UC Treatments / Results  Labs (all labs ordered are listed, but only abnormal results are displayed) Labs Reviewed  SARS CORONAVIRUS 2 (TAT 6-24 HRS)  CULTURE, GROUP A STREP [facility])  POCT RAPID STREP A (OFFICE)  POCT INFLUENZA A/B    EKG   Radiology No results found.  Procedures Procedures (including critical care time)  Medications Ordered in UC Medications - No data to display  Initial Impression / Assessment and Plan / UC Course  I have reviewed the triage vital signs and the nursing notes.  Pertinent labs & imaging results that were available during my care of the patient were reviewed by me and considered in my medical decision making (see chart for details).  The patient is well-appearing, she is in no acute distress, she is hypertensive, but vital signs are otherwise stable.  Repeat BP at discharge was 158/84.  Rapid strep test and influenza test were negative.  COVID test and throat culture are pending.  Patient is able to receive Paxlovid if her COVID test is positive (she was advised she will need to hold her Crestor for 2 weeks).  Suspect symptoms are of viral etiology at this time.  Will provide symptomatic  treatment for sore throat with viscous lidocaine 2% for patient to gargle and spit.  Will also provide prescribed fluticasone 50 mcg nasal spray for nasal congestion, and postnasal drainage.  Supportive care recommendations were provided and discussed with the patient to include increasing fluids, allowing for plenty of rest, over-the-counter analgesics, normal saline nasal spray, and warm salt water gargles.  Discussed viral etiology with the patient and when follow-up may be indicated.  Also discussed with patient that if his test is positive for COVID, she should remain home until she has been fever free for 24 hours with no medication.  Patient is in agreement with this plan of care and verbalizes understanding.  All questions were answered.  Patient stable for discharge.   Final Clinical Impressions(s) / UC Diagnoses   Final diagnoses:  Viral illness  Sore throat  Encounter for screening for COVID-19   Discharge Instructions   None    ED Prescriptions     Medication Sig Dispense Auth. Provider   lidocaine (XYLOCAINE) 2 % solution Gargle and spit 5 mL every 6 hours as needed for throat pain or discomfort. 100 mL Neyla Gauntt-Warren, Sadie Haber,  NP   fluticasone (FLONASE) 50 MCG/ACT nasal spray Place 2 sprays into both nostrils daily. 16 g Ediel Unangst-Warren, Sadie Haber, NP      PDMP not reviewed this encounter.   Abran Cantor, NP 06/24/23 1318

## 2023-06-24 NOTE — ED Triage Notes (Signed)
Pt reports she has a fever, sore throat, and headache x 2 days

## 2023-06-24 NOTE — Discharge Instructions (Signed)
The rapid strep test was negative.  A throat culture and COVID test are pending.  You will be contacted if the pending test result is positive.  You will also have access to your results via MyChart.  As discussed, if your COVID test is positive, and you require medication for COVID, you will need to hold your Crestor for 2 weeks. Take medication as prescribed. Continue over-the-counter Tylenol as needed for pain, fever, general discomfort. Increase fluids and allow for plenty of rest. Warm salt water gargles 3-4 times daily as needed for throat pain or discomfort. Recommend normal saline nasal spray throughout the day to help with nasal congestion and runny nose. If your cough worsens, recommend using a humidifier in your bedroom at nighttime during sleep and sleeping elevated on pillows while cough symptoms persist. If your COVID test is positive and you have a fever, you will need to remain home until you have been fever free for 24 hours with no medication.  You are able to resume your normal activities if you have symptoms as long as you are wearing a mask.  Also, if you take medication for COVID, you will need to wear your mask for you to take the medication.  If you continue to experience symptoms after completing the medication, you will need to wear your mask for an additional 5 days. Please be advised that this most likely is a viral illness and symptoms can last up to 14 days.  If symptoms extend beyond that time, or symptoms suddenly worsen, please follow-up in this clinic or with your primary care physician for further evaluation. If your COVID test is positive, please notify your primary care physician. Follow-up as needed.

## 2023-06-25 LAB — SARS CORONAVIRUS 2 (TAT 6-24 HRS): SARS Coronavirus 2: NEGATIVE

## 2023-06-27 LAB — CULTURE, GROUP A STREP (THRC)

## 2023-06-29 DIAGNOSIS — K219 Gastro-esophageal reflux disease without esophagitis: Secondary | ICD-10-CM | POA: Diagnosis not present

## 2023-06-29 DIAGNOSIS — R1310 Dysphagia, unspecified: Secondary | ICD-10-CM | POA: Diagnosis not present

## 2023-07-05 ENCOUNTER — Other Ambulatory Visit (HOSPITAL_COMMUNITY)
Admission: RE | Admit: 2023-07-05 | Discharge: 2023-07-05 | Disposition: A | Discharge status: Home / Self Care | Payer: Medicare PPO | Source: Ambulatory Visit | Attending: Obstetrics & Gynecology | Admitting: Obstetrics & Gynecology

## 2023-07-05 ENCOUNTER — Encounter: Payer: Self-pay | Admitting: Obstetrics & Gynecology

## 2023-07-05 ENCOUNTER — Ambulatory Visit: Payer: Medicare PPO | Admitting: Obstetrics & Gynecology

## 2023-07-05 VITALS — BP 161/90 | HR 65 | Ht 65.0 in | Wt 133.0 lb

## 2023-07-05 DIAGNOSIS — Z01419 Encounter for gynecological examination (general) (routine) without abnormal findings: Secondary | ICD-10-CM

## 2023-07-05 DIAGNOSIS — B009 Herpesviral infection, unspecified: Secondary | ICD-10-CM

## 2023-07-05 DIAGNOSIS — B085 Enteroviral vesicular pharyngitis: Secondary | ICD-10-CM | POA: Diagnosis not present

## 2023-07-05 MED ORDER — VALACYCLOVIR HCL 1 G PO TABS: 1000.0000 mg | ORAL_TABLET | Freq: Three times a day (TID) | ORAL | 0 refills | Status: DC

## 2023-07-05 NOTE — Progress Notes (Signed)
Subjective:     Sue Gutierrez is a 67 y.o. female here for a routine exam.  No LMP recorded. Patient is postmenopausal. G2P2 Birth Control Method:  menoapusal Menstrual Calendar(currently): amenorrhea  Current complaints: painful oral lesions, and vulvar lesion 4-5 days.   Current acute medical issues:     Recent Gynecologic History No LMP recorded. Patient is postmenopausal. Last Pap: 5 years ago,  normal Last mammogram: 12/2021,  normal  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   Melanoma in situ (HCC) 2004   Left leg   Palpitations 2012   Event recorder in 09/2011-multiple spells with palpitations and dyspnea, mostly with sinus rhythm but some with PACs or PVCs; normal TSH of 3.1 in 07/2011   Salivary gland tumor     Past Surgical History:  Procedure Laterality Date   CARPAL TUNNEL RELEASE     COLONOSCOPY WITH PROPOFOL N/A 09/14/2020   Procedure: COLONOSCOPY WITH PROPOFOL;  Surgeon: Dolores Frame, MD;  Location: [facility];  Service: Gastroenterology;  Laterality: N/A;  10:45   MELANOMA EXCISION     salivary gland tumor      OB History     Gravida  2   Para  2   Term      Preterm      AB      Living  2      SAB      IAB      Ectopic      Multiple      Live Births              Social History   Socioeconomic History   Marital status: Divorced    Spouse name: Not on file   Number of children: 2   Years of education: 16 yrs   Highest education level: Not on file  Occupational History   Occupation: Pension scheme manager  Tobacco Use   Smoking status: Former   Smokeless tobacco: Never  Advertising account planner   Vaping status: Never Used  Substance and Sexual Activity   Alcohol use: Yes    Alcohol/week: 1.0 standard drink of alcohol    Types: 1 Standard drinks or equivalent per week    Comment: occasionally   Drug use: No   Sexual activity:  Yes    Birth control/protection: Post-menopausal  Other Topics Concern   Not on file  Social History Narrative   Not on file   Social Determinants of Health   Financial Resource Strain: Not on file  Food Insecurity: Not on file  Transportation Needs: Not on file  Physical Activity: Not on file  Stress: Not on file  Social Connections: Not on file    Family History  Problem Relation Age of Onset   Stroke Father    Cancer Brother    Colon cancer Neg Hx    Esophageal cancer Neg Hx    Stomach cancer Neg Hx      Current Outpatient Medications:    cycloSPORINE (RESTASIS) 0.05 % ophthalmic emulsion, , Disp: , Rfl:    famotidine (PEPCID) 40 MG tablet, Take 40 mg by mouth daily., Disp: , Rfl:    fluticasone (FLONASE) 50 MCG/ACT nasal spray, Place 2 sprays into both nostrils daily., Disp: 16 g, Rfl: 0   PREMARIN vaginal cream, INSERT ONE GRAM INTO THE VAGINA ONCE DAILY, Disp: 30 g, Rfl:  6   valACYclovir (VALTREX) 1000 MG tablet, Take 1 tablet (1,000 mg total) by mouth 3 (three) times daily., Disp: 30 tablet, Rfl: 0   VITAMIN D PO, Take by mouth., Disp: , Rfl:    lidocaine (XYLOCAINE) 2 % solution, Gargle and spit 5 mL every 6 hours as needed for throat pain or discomfort. (Patient not taking: Reported on 07/05/2023), Disp: 100 mL, Rfl: 0   naproxen sodium (ALEVE) 220 MG tablet, Take 220 mg by mouth 2 (two) times daily as needed (pain.). (Patient not taking: Reported on 07/05/2023), Disp: , Rfl:    rosuvastatin (CRESTOR) 5 MG tablet, Take 5 mg by mouth at bedtime. (Patient not taking: Reported on 07/05/2023), Disp: , Rfl:   Review of Systems  Review of Systems  Constitutional: Negative for fever, chills, weight loss, malaise/fatigue and diaphoresis.  HENT: Negative for hearing loss, ear pain, nosebleeds, congestion, sore throat, neck pain, tinnitus and ear discharge.   Eyes: Negative for blurred vision, double vision, photophobia, pain, discharge and redness.  Respiratory: Negative for  cough, hemoptysis, sputum production, shortness of breath, wheezing and stridor.   Cardiovascular: Negative for chest pain, palpitations, orthopnea, claudication, leg swelling and PND.  Gastrointestinal: negative for abdominal pain. Negative for heartburn, nausea, vomiting, diarrhea, constipation, blood in stool and melena.  Genitourinary: Negative for dysuria, urgency, frequency, hematuria and flank pain.  Musculoskeletal: Negative for myalgias, back pain, joint pain and falls.  Skin: Negative for itching and rash.  Neurological: Negative for dizziness, tingling, tremors, sensory change, speech change, focal weakness, seizures, loss of consciousness, weakness and headaches.  Endo/Heme/Allergies: Negative for environmental allergies and polydipsia. Does not bruise/bleed easily.  Psychiatric/Behavioral: Negative for depression, suicidal ideas, hallucinations, memory loss and substance abuse. The patient is not nervous/anxious and does not have insomnia.        Objective:  Blood pressure (!) 161/90, pulse 65, height 5\' 5"  (1.651 m), weight 133 lb (60.3 kg).   Physical Exam  Vitals reviewed. Constitutional: She is oriented to person, place, and time. She appears well-developed and well-nourished.  HENT:  Head: Normocephalic and atraumatic.        Right Ear: External ear normal.  Left Ear: External ear normal.  Nose: Nose normal.  Mouth/Throat: Oropharynx is clear and moist.  Eyes: Conjunctivae and EOM are normal. Pupils are equal, round, and reactive to light. Right eye exhibits no discharge. Left eye exhibits no discharge. No scleral icterus.  Neck: Normal range of motion. Neck supple. No tracheal deviation present. No thyromegaly present.  Cardiovascular: Normal rate, regular rhythm, normal heart sounds and intact distal pulses.  Exam reveals no gallop and no friction rub.   No murmur heard. Respiratory: Effort normal and breath sounds normal. No respiratory distress. She has no wheezes.  She has no rales. She exhibits no tenderness.  GI: Soft. Bowel sounds are normal. She exhibits no distension and no mass. There is no tenderness. There is no rebound and no guarding.  Genitourinary:  Breasts no masses skin changes or nipple changes bilaterally      Vulva is normal without lesions Vagina is pink moist without discharge Cervix normal in appearance and pap is done Bimanual no done, will do in 6 weeks Musculoskeletal: Normal range of motion. She exhibits no edema and no tenderness.  Neurological: She is alert and oriented to person, place, and time. She has normal reflexes. She displays normal reflexes. No cranial nerve deficit. She exhibits normal muscle tone. Coordination normal.  Skin: Skin is warm and dry.  No rash noted. No erythema. No pallor.  Psychiatric: She has a normal mood and affect. Her behavior is normal. Judgment and thought content normal.       Medications Ordered at today's visit: Meds ordered this encounter  Medications   valACYclovir (VALTREX) 1000 MG tablet    Sig: Take 1 tablet (1,000 mg total) by mouth 3 (three) times daily.    Dispense:  30 tablet    Refill:  0    Other orders placed at today's visit: Orders Placed This Encounter  Procedures   Herpes simplex virus culture      Assessment:    Normal Gyn exam.   HSV 1 vulvitis Coxsackie pharyngitis Plan:    Contraception: post menopausal status. Follow up in: 6 weeks. To do actual bimanual exam      Return in about 6 weeks (around 08/16/2023) for Follow up, with Dr Despina Hidden.

## 2023-07-07 NOTE — Progress Notes (Deleted)
Referring Provider: Lewis Moccasin, MD Primary Care Physician:  Lewis Moccasin, MD Primary GI Physician: Dr. Levon Hedger  No chief complaint on file.   HPI:   Sue Gutierrez is a 67 y.o. female presenting today at the request of Lewis Moccasin, MD for GERD and dysphagia.  Today:      Colonoscopy 09/14/2020 with normal exam.  Recommended 10-year screening colonoscopy.  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   Melanoma in situ (HCC) 2004   Left leg   Palpitations 2012   Event recorder in 09/2011-multiple spells with palpitations and dyspnea, mostly with sinus rhythm but some with PACs or PVCs; normal TSH of 3.1 in 07/2011   Salivary gland tumor     Past Surgical History:  Procedure Laterality Date   CARPAL TUNNEL RELEASE     COLONOSCOPY WITH PROPOFOL N/A 09/14/2020   Procedure: COLONOSCOPY WITH PROPOFOL;  Surgeon: Dolores Frame, MD;  Location: [facility];  Service: Gastroenterology;  Laterality: N/A;  10:45   MELANOMA EXCISION     salivary gland tumor      Current Outpatient Medications  Medication Sig Dispense Refill   cycloSPORINE (RESTASIS) 0.05 % ophthalmic emulsion      famotidine (PEPCID) 40 MG tablet Take 40 mg by mouth daily.     fluticasone (FLONASE) 50 MCG/ACT nasal spray Place 2 sprays into both nostrils daily. 16 g 0   lidocaine (XYLOCAINE) 2 % solution Gargle and spit 5 mL every 6 hours as needed for throat pain or discomfort. (Patient not taking: Reported on 07/05/2023) 100 mL 0   naproxen sodium (ALEVE) 220 MG tablet Take 220 mg by mouth 2 (two) times daily as needed (pain.). (Patient not taking: Reported on 07/05/2023)     PREMARIN vaginal cream INSERT ONE GRAM INTO THE VAGINA ONCE DAILY 30 g 6   rosuvastatin (CRESTOR) 5 MG tablet Take 5 mg by mouth at bedtime. (Patient not taking: Reported on 07/05/2023)     valACYclovir (VALTREX) 1000  MG tablet Take 1 tablet (1,000 mg total) by mouth 3 (three) times daily. 30 tablet 0   VITAMIN D PO Take by mouth.     No current facility-administered medications for this visit.    Allergies as of 07/09/2023 - Review Complete 07/05/2023  Allergen Reaction Noted   Erythromycin Rash 04/11/2011   Penicillins Rash    Septra [bactrim] Rash 04/11/2011   Sulfamethoxazole-trimethoprim Rash     Family History  Problem Relation Age of Onset   Stroke Father    Cancer Brother    Colon cancer Neg Hx    Esophageal cancer Neg Hx    Stomach cancer Neg Hx     Social History   Socioeconomic History   Marital status: Divorced    Spouse name: Not on file   Number of children: 2   Years of education: 16 yrs   Highest education level: Not on file  Occupational History   Occupation: Pension scheme manager  Tobacco Use   Smoking status: Former   Smokeless tobacco: Never  Vaping Use   Vaping status: Never Used  Substance and Sexual Activity   Alcohol use: Yes    Alcohol/week: 1.0 standard drink of alcohol    Types: 1 Standard drinks or equivalent per week    Comment: occasionally   Drug use: No   Sexual activity:  Yes    Birth control/protection: Post-menopausal  Other Topics Concern   Not on file  Social History Narrative   Not on file   Social Determinants of Health   Financial Resource Strain: Not on file  Food Insecurity: Not on file  Transportation Needs: Not on file  Physical Activity: Not on file  Stress: Not on file  Social Connections: Not on file    Review of Systems: Gen: Denies fever, chills, anorexia. Denies fatigue, weakness, weight loss.  CV: Denies chest pain, palpitations, syncope, peripheral edema, and claudication. Resp: Denies dyspnea at rest, cough, wheezing, coughing up blood, and pleurisy. GI: Denies vomiting blood, jaundice, and fecal incontinence.   Denies dysphagia or odynophagia. Derm: Denies rash, itching, dry skin Psych: Denies depression,  anxiety, memory loss, confusion. No homicidal or suicidal ideation.  Heme: Denies bruising, bleeding, and enlarged lymph nodes.  Physical Exam: There were no vitals taken for this visit. General:   Alert and oriented. No distress noted. Pleasant and cooperative.  Head:  Normocephalic and atraumatic. Eyes:  Conjuctiva clear without scleral icterus. Heart:  S1, S2 present without murmurs appreciated. Lungs:  Clear to auscultation bilaterally. No wheezes, rales, or rhonchi. No distress.  Abdomen:  +BS, soft, non-tender and non-distended. No rebound or guarding. No HSM or masses noted. Msk:  Symmetrical without gross deformities. Normal posture. Extremities:  Without edema. Neurologic:  Alert and  oriented x4 Psych:  Normal mood and affect.    Assessment:     Plan:  ***   Ermalinda Memos, PA-C [facility] 07/09/2023

## 2023-07-08 LAB — HERPES SIMPLEX VIRUS CULTURE

## 2023-07-09 ENCOUNTER — Encounter (INDEPENDENT_AMBULATORY_CARE_PROVIDER_SITE_OTHER): Payer: Self-pay

## 2023-07-09 ENCOUNTER — Ambulatory Visit: Payer: Medicare PPO | Admitting: Gastroenterology

## 2023-07-11 LAB — CYTOLOGY - PAP
Comment: NEGATIVE
Diagnosis: UNDETERMINED — AB
High risk HPV: NEGATIVE

## 2023-07-16 ENCOUNTER — Ambulatory Visit: Payer: Medicare PPO | Admitting: Gastroenterology

## 2023-07-16 ENCOUNTER — Encounter: Payer: Self-pay | Admitting: *Deleted

## 2023-07-16 ENCOUNTER — Telehealth: Payer: Self-pay | Admitting: *Deleted

## 2023-07-16 ENCOUNTER — Encounter: Payer: Self-pay | Admitting: Gastroenterology

## 2023-07-16 VITALS — BP 168/97 | HR 50 | Temp 97.8°F | Ht 66.0 in | Wt 133.2 lb

## 2023-07-16 DIAGNOSIS — R12 Heartburn: Secondary | ICD-10-CM

## 2023-07-16 DIAGNOSIS — R059 Cough, unspecified: Secondary | ICD-10-CM | POA: Diagnosis not present

## 2023-07-16 DIAGNOSIS — R490 Dysphonia: Secondary | ICD-10-CM | POA: Diagnosis not present

## 2023-07-16 DIAGNOSIS — R1319 Other dysphagia: Secondary | ICD-10-CM

## 2023-07-16 DIAGNOSIS — K219 Gastro-esophageal reflux disease without esophagitis: Secondary | ICD-10-CM | POA: Diagnosis not present

## 2023-07-16 DIAGNOSIS — R1310 Dysphagia, unspecified: Secondary | ICD-10-CM | POA: Insufficient documentation

## 2023-07-16 MED ORDER — OMEPRAZOLE 20 MG PO CPDR: 20.0000 mg | DELAYED_RELEASE_CAPSULE | Freq: Every day | ORAL | 3 refills | Status: DC

## 2023-07-16 NOTE — Patient Instructions (Signed)
Start omeprazole 20mg  daily before breakfast.  Change famotidine to 40mg  at supper or bedtime only. Upper endoscopy to be scheduled.  Reach out if you have any questions or concerns!

## 2023-07-16 NOTE — Telephone Encounter (Signed)
PA approved via [employer]here for EGD. ED does not require PA Authorization #[id number], DOS: 07/27/2023 - 09/26/2023

## 2023-07-16 NOTE — Progress Notes (Addendum)
GI Office Note    Referring Provider: Fauna Neuner Moccasin, MD Primary Care Physician:  Terrick Allred Moccasin, MD  Primary Gastroenterologist: Dr. Levon Hedger  Chief Complaint   Chief Complaint  Patient presents with   Dysphagia    States that at times it feels like food is getting hung up in her throat, has to think about it before swallowing.   Gastroesophageal Reflux     History of Present Illness   Sue Gutierrez is a 67 y.o. female presenting today at the request of Dr. Duanne Guess for GERD/dysphagia.   Hoarse all the time. Constant throat clearing. Chronic cough, worse at night. She has been doing this for at least 6 years. She had always thought it was allergies but noted no improvement with taking allergy medication. More recently started taking OTC famotidine once daily, her PCP increased her to 40mg  BID. On H2blocker for one month now. She has notes some improvement in hoarseness and throat clearing. If not on H2 blocker she has coughing through the night.   Swallowing issues noted first about 15 years ago. Infrequent episodes at first. Has food stick in the throat region and has to swallow hard or sometime vomit food back up for relief. Usually happens with dry bread, meats. She has started being really careful, chewing food thoroughly, avoiding food triggers. Currently symptoms happening about once per month but when it happens she has significant symptoms. Always has had issues with swallowing larger pills. Sometimes heartburn burns into throat/neck.  H.pylori breat test negative 06/2023.  Had come off blood pressure medication and controlling BP with diet. She is aware that she may need to resume BP meds or get more serious with her diet.   Colonoscopy 09/2020: -entire examined colon normal -colonoscopy 10 years   Medications   Current Outpatient Medications  Medication Sig Dispense Refill   cycloSPORINE (RESTASIS) 0.05 % ophthalmic emulsion      famotidine  (PEPCID) 40 MG tablet Take 40 mg by mouth 2 (two) times daily.     fluticasone (FLONASE) 50 MCG/ACT nasal spray Place 2 sprays into both nostrils daily. 16 g 0   naproxen sodium (ALEVE) 220 MG tablet Take 220 mg by mouth 2 (two) times daily as needed (pain.).     PREMARIN vaginal cream INSERT ONE GRAM INTO THE VAGINA ONCE DAILY 30 g 6   rosuvastatin (CRESTOR) 5 MG tablet Take 5 mg by mouth at bedtime.     valACYclovir (VALTREX) 1000 MG tablet Take 1 tablet (1,000 mg total) by mouth 3 (three) times daily. 30 tablet 0   VITAMIN D PO Take by mouth.     No current facility-administered medications for this visit.    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   Melanoma in situ (HCC) 2004   Left leg   Palpitations 2012   Event recorder in 09/2011-multiple spells with palpitations and dyspnea, mostly with sinus rhythm but some with PACs or PVCs; normal TSH of 3.1 in 07/2011   Salivary gland tumor     Past Surgical History   Past Surgical History:  Procedure Laterality  Date   CARPAL TUNNEL RELEASE     COLONOSCOPY WITH PROPOFOL N/A 09/14/2020   Procedure: COLONOSCOPY WITH PROPOFOL;  Surgeon: Dolores Frame, MD;  Location: [facility];  Service: Gastroenterology;  Laterality: N/A;  10:45   MELANOMA EXCISION     salivary gland tumor      Past Family History   Family History  Problem Relation Age of Onset   Stroke Father    Cancer Brother    Colon cancer Neg Hx    Esophageal cancer Neg Hx    Stomach cancer Neg Hx     Past Social History   Social History   Socioeconomic History   Marital status: Divorced    Spouse name: Not on file    Number of children: 2   Years of education: 16 yrs   Highest education level: Not on file  Occupational History   Occupation: Pension scheme manager  Tobacco Use   Smoking status: Former   Smokeless tobacco: Never  Advertising account planner   Vaping status: Never Used  Substance and Sexual Activity   Alcohol use: Yes    Alcohol/week: 1.0 standard drink of alcohol    Types: 1 Standard drinks or equivalent per week    Comment: occasionally   Drug use: No   Sexual activity: Yes    Birth control/protection: Post-menopausal  Other Topics Concern   Not on file  Social History Narrative   Not on file   Social Determinants of Health   Financial Resource Strain: Not on file  Food Insecurity: Not on file  Transportation Needs: Not on file  Physical Activity: Not on file  Stress: Not on file  Social Connections: Not on file  Intimate Partner Violence: Not on file    Review of Systems   General: Negative for anorexia, weight loss, fever, chills, fatigue, weakness. Eyes: Negative for vision changes.  ENT: Negative for nasal congestion. See hpi CV: Negative for chest pain, angina, palpitations, dyspnea on exertion, peripheral edema.  Respiratory: Negative for dyspnea at rest, dyspnea on exertion, cough, sputum, wheezing.  GI: See history of present illness. GU:  Negative for dysuria, hematuria, urinary incontinence, urinary frequency, nocturnal urination.  MS: Negative for joint pain, low back pain.  Derm: Negative for rash or itching.  Neuro: Negative for weakness, abnormal sensation, seizure, frequent headaches, memory loss,  confusion.  Psych: Negative for anxiety, depression, suicidal ideation, hallucinations.  Endo: Negative for unusual weight change.  Heme: Negative for bruising or bleeding. Allergy: Negative for rash or hives.  Physical Exam   BP (!) 168/97 (BP Location: Right Arm, Patient Position: Sitting, Cuff Size: Normal)   Pulse (!) 50   Temp 97.8 F (36.6 C) (Oral)   Ht  5\' 6"  (1.676 m)   Wt 133 lb 3.2 oz (60.4 kg)   SpO2 98%   BMI 21.50 kg/m    General: Well-nourished, well-developed in no acute distress.  Head: Normocephalic, atraumatic.   Eyes: Conjunctiva pink, no icterus. Mouth: Oropharyngeal mucosa moist and pink   Neck: Supple without thyromegaly, masses, or lymphadenopathy.  Lungs: Clear to auscultation bilaterally.  Heart: Regular rate and rhythm, no murmurs rubs or gallops.  Abdomen: Bowel sounds are normal, nontender, nondistended, no hepatosplenomegaly or masses,  no abdominal bruits or hernia, no rebound or guarding.   Rectal: not performed Extremities: No lower extremity edema. No clubbing or deformities.  Neuro: Alert and oriented x 4 , grossly normal neurologically.  Skin: Warm and dry, no rash or jaundice.  Psych: Alert and cooperative, normal mood and affect.  Labs  Not performed Imaging Studies   No results found.  Assessment/Plan:   *GERD/LPR -chronic hoarseness/cough/heartburn for years. Hoarseness/cough did not respond to allergy medication and may be due to LPR. Seems to have noted some improvement on H2 blocker, may benefit from PPI/H2 combo treatment -start omeprazole 20mg  daily before breakfast, continue famotidine 40mg  at supper or bedtime -consider EGD to evaluate for complicated GERD, hiatal hernia -reinforced antireflux measures  *Dysphagia -to breads/meats, infrequent but patient has made significant lifestyle/food modifications trying to control symptoms.  -suspect esophageal stricture/ring, need to rule out EOE, Zenker's diverticulum remains possibility as well -offer EGD/ED   EGD/ED with Dr. Levon Hedger. ASA 2.  I have discussed the risks, alternatives, benefits with regards to but not limited to the risk of reaction to medication, bleeding, infection, perforation and the patient is agreeable to proceed. Written consent to be obtained.     Leanna Battles. Melvyn Neth, MHS, PA-C [facility]  I have reviewed the note and agree with the APP's assessment as described in this progress note  Katrinka Blazing, MD Gastroenterology and Hepatology [facility]

## 2023-07-25 ENCOUNTER — Encounter: Payer: Self-pay | Admitting: *Deleted

## 2023-07-25 ENCOUNTER — Telehealth: Payer: Self-pay | Admitting: *Deleted

## 2023-07-25 ENCOUNTER — Encounter (HOSPITAL_COMMUNITY): Payer: Self-pay

## 2023-07-25 ENCOUNTER — Other Ambulatory Visit: Payer: Self-pay

## 2023-07-25 ENCOUNTER — Encounter (HOSPITAL_COMMUNITY)
Admission: RE | Admit: 2023-07-25 | Discharge: 2023-07-25 | Disposition: A | Discharge status: Home / Self Care | Payer: Medicare PPO | Source: Ambulatory Visit | Attending: Gastroenterology | Admitting: Gastroenterology

## 2023-07-25 NOTE — Telephone Encounter (Signed)
Pt returned call and she states that she is coughing a little more then she was earlier this morning. She wanted to go ahead and reschedule. She has been rescheduled until 08/21/23. Updated instructions mailed to pt.

## 2023-07-25 NOTE — Telephone Encounter (Signed)
LMTRC

## 2023-07-25 NOTE — Telephone Encounter (Signed)
Pt left vm stating that she woke up with laryngitis and she has procedure for EGD/ED on Friday 07/27/23.  Called pt back and she states just woke up with the laryngitis, no fever and doesn't feel bad.  She wants to know if she can go through with the procedure or should she reschedule. Please advise. Thank you

## 2023-07-25 NOTE — Telephone Encounter (Signed)
Thanks

## 2023-07-25 NOTE — Telephone Encounter (Signed)
So what does she refer to with laryngitis? Is it only sore throat or hoarseness? If she is having active respiratory symptoms (cough, shortness of breath, fever, wheezing), she should reschedule her procedure (should wait at least 2 weeks).

## 2023-07-31 DIAGNOSIS — R1310 Dysphagia, unspecified: Secondary | ICD-10-CM | POA: Diagnosis not present

## 2023-07-31 DIAGNOSIS — E559 Vitamin D deficiency, unspecified: Secondary | ICD-10-CM | POA: Diagnosis not present

## 2023-07-31 DIAGNOSIS — R5383 Other fatigue: Secondary | ICD-10-CM | POA: Diagnosis not present

## 2023-07-31 DIAGNOSIS — I10 Essential (primary) hypertension: Secondary | ICD-10-CM | POA: Diagnosis not present

## 2023-08-06 ENCOUNTER — Encounter: Payer: Self-pay | Admitting: *Deleted

## 2023-08-06 NOTE — Telephone Encounter (Signed)
Pt needed to reschedule her procedure on 08/21/23 due to getting a new job.  Pt has been rescheduled to 08/31/23 at 10:00 am. Updated instructions sent via Mychart.

## 2023-08-07 DIAGNOSIS — I10 Essential (primary) hypertension: Secondary | ICD-10-CM | POA: Diagnosis not present

## 2023-08-07 DIAGNOSIS — E559 Vitamin D deficiency, unspecified: Secondary | ICD-10-CM | POA: Diagnosis not present

## 2023-08-07 DIAGNOSIS — Z23 Encounter for immunization: Secondary | ICD-10-CM | POA: Diagnosis not present

## 2023-08-07 DIAGNOSIS — R7989 Other specified abnormal findings of blood chemistry: Secondary | ICD-10-CM | POA: Diagnosis not present

## 2023-08-20 ENCOUNTER — Other Ambulatory Visit (HOSPITAL_COMMUNITY)
Admission: RE | Admit: 2023-08-20 | Discharge: 2023-08-20 | Disposition: A | Discharge status: Home / Self Care | Payer: Medicare PPO | Source: Ambulatory Visit | Attending: Obstetrics & Gynecology | Admitting: Obstetrics & Gynecology

## 2023-08-20 ENCOUNTER — Ambulatory Visit: Payer: Medicare PPO | Admitting: Obstetrics & Gynecology

## 2023-08-20 ENCOUNTER — Encounter: Payer: Self-pay | Admitting: Obstetrics & Gynecology

## 2023-08-20 VITALS — BP 188/93 | HR 52 | Ht 66.0 in | Wt 134.5 lb

## 2023-08-20 DIAGNOSIS — Z113 Encounter for screening for infections with a predominantly sexual mode of transmission: Secondary | ICD-10-CM | POA: Diagnosis not present

## 2023-08-20 NOTE — Progress Notes (Signed)
Follow up appointment for response:   Chief Complaint  Patient presents with   Follow-up    Pt feels good!!!    Blood pressure (!) 154/86, pulse (!) 51, height 5\' 6"  (1.676 m), weight 134 lb 8 oz (61 kg).   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   Orders for this encounter: Orders Placed This Encounter  Procedures   RPR+HBsAg+HIV   Hepatitis C Antibody    Impression + Management Plan   ICD-10-CM   1. Screening examination for STI  Z11.3 RPR+HBsAg+HIV    Hepatitis C Antibody      Follow Up: Return if symptoms worsen or fail to improve.     All questions were answered.  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   Melanoma in situ (HCC) 2004   Left leg   Palpitations 2012   Event recorder in 09/2011-multiple spells with palpitations and dyspnea, mostly with sinus rhythm but some with PACs or PVCs; normal TSH of 3.1 in 07/2011   Salivary gland tumor     Past Surgical History:  Procedure Laterality Date   CARPAL TUNNEL RELEASE Right    COLONOSCOPY WITH PROPOFOL N/A 09/14/2020   Procedure: COLONOSCOPY WITH PROPOFOL;  Surgeon: Dolores Frame, MD;  Location: [facility];  Service: Gastroenterology;  Laterality: N/A;  10:45   MELANOMA EXCISION     salivary gland tumor      OB History     Gravida  2   Para  2   Term      Preterm      AB      Living  2      SAB      IAB      Ectopic      Multiple      Live Births              Allergies  Allergen Reactions   Erythromycin Rash   Penicillins Rash    .Has patient had a PCN reaction causing immediate rash, facial/tongue/throat swelling, SOB or lightheadedness  with hypotension: Yes Has patient had a PCN reaction causing severe rash involving mucus membranes or skin necrosis: No Has patient had a PCN reaction that required hospitalization: No Has patient had a PCN reaction occurring within the last 10 years: No If all of the above answers are "NO", then may proceed with Cephalosporin use.    Septra [Bactrim] Rash   Sulfamethoxazole-Trimethoprim Rash    Social History   Socioeconomic History   Marital status: Divorced    Spouse name: Not on file   Number of children: 2   Years of education: 16 yrs   Highest education level: Not on file  Occupational History   Occupation: Pension scheme manager  Tobacco Use   Smoking status: Former   Smokeless tobacco: Never  Vaping Use   Vaping status: Never Used  Substance and Sexual Activity   Alcohol use: Yes    Alcohol/week: 1.0 standard drink of alcohol    Types: 1 Standard drinks or equivalent per week    Comment: occasionally   Drug  use: No   Sexual activity: Yes    Birth control/protection: Post-menopausal  Other Topics Concern   Not on file  Social History Narrative   Not on file   Social Determinants of Health   Financial Resource Strain: Not on file  Food Insecurity: Not on file  Transportation Needs: Not on file  Physical Activity: Not on file  Stress: Not on file  Social Connections: Not on file    Family History  Problem Relation Age of Onset   Stroke Father    Cancer Brother    Colon cancer Neg Hx    Esophageal cancer Neg Hx    Stomach cancer Neg Hx

## 2023-08-21 LAB — RPR+HBSAG+HIV
HIV Screen 4th Generation wRfx: NONREACTIVE
Hepatitis B Surface Ag: NEGATIVE
RPR Ser Ql: NONREACTIVE

## 2023-08-21 LAB — HEPATITIS C ANTIBODY: Hep C Virus Ab: NONREACTIVE

## 2023-08-21 LAB — CERVICOVAGINAL ANCILLARY ONLY
Chlamydia: NEGATIVE
Comment: NEGATIVE
Comment: NEGATIVE
Comment: NORMAL
Neisseria Gonorrhea: NEGATIVE
Trichomonas: NEGATIVE

## 2023-08-23 DIAGNOSIS — I10 Essential (primary) hypertension: Secondary | ICD-10-CM | POA: Diagnosis not present

## 2023-08-27 DIAGNOSIS — I10 Essential (primary) hypertension: Secondary | ICD-10-CM | POA: Diagnosis not present

## 2023-08-27 DIAGNOSIS — R001 Bradycardia, unspecified: Secondary | ICD-10-CM | POA: Diagnosis not present

## 2023-08-28 ENCOUNTER — Telehealth: Payer: Self-pay | Admitting: *Deleted

## 2023-08-28 DIAGNOSIS — R001 Bradycardia, unspecified: Secondary | ICD-10-CM | POA: Diagnosis not present

## 2023-08-28 NOTE — Telephone Encounter (Signed)
It is hard to say without known what her BPs are running. Endo/anesthesia will have BP criteria she will have to meet. Can we get more information? I see vitals from yesterday showing BP 179/84 and pulse 48 but if she has log of what it has been running this could help.  I will also tag Dr. Levon Hedger since he would be the one performing the procedure.

## 2023-08-28 NOTE — Telephone Encounter (Signed)
Pt came to office and stated she was having issues with her blood pressure being high and pulse being low. She says she is working with her doctor to get blood pressure under control and was started on medication. She is scheduled Friday 08/31/23 for EGD/ED.  Please advise. Thank you

## 2023-08-28 NOTE — Telephone Encounter (Signed)
Thanks, please take her off the schedule and notify endo. If we can add any of the screening colonoscopies or urgent cases in this time slot, please do so. Thanks

## 2023-08-28 NOTE — Telephone Encounter (Signed)
Noted, she can call when she is ready to reschedule.

## 2023-08-28 NOTE — Telephone Encounter (Signed)
Pt states she has been keeping track of BP: today it was 147/85, yesterday: She had taken them at different times during the day, readings of 152/80, 140/87,152/79,166/80, 143/80, 147/88,148/86 and a few days ago it was 155/87, 159/92.  She says she is was told it was not normal for her pulse to go down and BP to go up.  She says she is cancelling her procedure at this time because she is afraid and wanted to get her BP and pulse under control before proceeding.

## 2023-08-29 NOTE — Telephone Encounter (Signed)
I already taken her off the schedule and endo has been notified.

## 2023-08-30 ENCOUNTER — Encounter (HOSPITAL_COMMUNITY): Payer: Self-pay | Admitting: Anesthesiology

## 2023-08-30 ENCOUNTER — Encounter: Payer: Self-pay | Admitting: Obstetrics & Gynecology

## 2023-08-30 MED ORDER — VALACYCLOVIR HCL 500 MG PO TABS: 500.0000 mg | ORAL_TABLET | Freq: Every day | ORAL | 11 refills | Status: DC

## 2023-08-30 MED ORDER — VALACYCLOVIR HCL 1 G PO TABS: 1000.0000 mg | ORAL_TABLET | Freq: Two times a day (BID) | ORAL | 11 refills | Status: DC

## 2023-08-30 NOTE — Addendum Note (Signed)
Addended by: Lazaro Arms on: 08/30/2023 10:45 PM   Modules accepted: Orders

## 2023-08-31 ENCOUNTER — Encounter (HOSPITAL_COMMUNITY): Admission: RE | Payer: Self-pay | Source: Home / Self Care

## 2023-08-31 ENCOUNTER — Ambulatory Visit (HOSPITAL_COMMUNITY): Admission: RE | Admit: 2023-08-31 | Payer: Medicare PPO | Source: Home / Self Care | Admitting: Gastroenterology

## 2023-08-31 SURGERY — ESOPHAGOGASTRODUODENOSCOPY (EGD) WITH PROPOFOL
Anesthesia: Monitor Anesthesia Care

## 2023-09-10 DIAGNOSIS — E785 Hyperlipidemia, unspecified: Secondary | ICD-10-CM | POA: Diagnosis not present

## 2023-09-10 DIAGNOSIS — K219 Gastro-esophageal reflux disease without esophagitis: Secondary | ICD-10-CM | POA: Diagnosis not present

## 2023-09-10 DIAGNOSIS — I10 Essential (primary) hypertension: Secondary | ICD-10-CM | POA: Diagnosis not present

## 2023-09-10 DIAGNOSIS — R001 Bradycardia, unspecified: Secondary | ICD-10-CM | POA: Diagnosis not present

## 2023-09-10 DIAGNOSIS — Z23 Encounter for immunization: Secondary | ICD-10-CM | POA: Diagnosis not present

## 2023-12-10 ENCOUNTER — Encounter: Payer: Self-pay | Admitting: Orthopedic Surgery

## 2023-12-10 ENCOUNTER — Other Ambulatory Visit (INDEPENDENT_AMBULATORY_CARE_PROVIDER_SITE_OTHER): Payer: Self-pay

## 2023-12-10 ENCOUNTER — Ambulatory Visit: Payer: Medicare PPO | Admitting: Orthopedic Surgery

## 2023-12-10 DIAGNOSIS — M5410 Radiculopathy, site unspecified: Secondary | ICD-10-CM | POA: Diagnosis not present

## 2023-12-10 DIAGNOSIS — M545 Low back pain, unspecified: Secondary | ICD-10-CM

## 2023-12-10 DIAGNOSIS — M4316 Spondylolisthesis, lumbar region: Secondary | ICD-10-CM

## 2023-12-10 DIAGNOSIS — M47896 Other spondylosis, lumbar region: Secondary | ICD-10-CM

## 2023-12-10 NOTE — Progress Notes (Signed)
  Intake history:  There were no vitals taken for this visit. There is no height or weight on file to calculate BMI.    WHAT ARE WE SEEING YOU FOR TODAY?   left hip(s)  How long has this bothered you? (DOI?DOS?WS?)  3 year(s) ago  Anticoag.  No  Diabetes No  Heart disease No  Hypertension Yes  SMOKING HX No  Kidney disease No  Any ALLERGIES ______________________________________________   Treatment:  Have you taken:  Tylenol Yes  Advil No  Had PT No  Had injection No  Other  _________________________

## 2023-12-10 NOTE — Progress Notes (Signed)
  Subjective:     Patient ID: Sue Gutierrez, female   DOB: [birth year], 68 y.o.   MRN: [id number]  Sue Gutierrez presents as a 68 year old female with left hip pain for about 3 years unrelieved by Tylenol  Trauma none  The pain is located on the lateral hip area radiates down the leg into the knee  Pain increases with sitting  She has had 2 episodes where the hip felt like it went in and out of place and she had to guide it back in    Hip Pain      Review of Systems     Objective:   Physical Exam Vitals and nursing note reviewed.  Constitutional:      Appearance: Normal appearance.  HENT:     Head: Normocephalic and atraumatic.  Eyes:     General: No scleral icterus.       Right eye: No discharge.        Left eye: No discharge.     Extraocular Movements: Extraocular movements intact.     Conjunctiva/sclera: Conjunctivae normal.     Pupils: Pupils are equal, round, and reactive to light.  Cardiovascular:     Rate and Rhythm: Normal rate.     Pulses: Normal pulses.  Musculoskeletal:     Comments: Right hip, left hip normal range of motion with flexion 25 degrees of internal rotation with no groin pain  Mild tenderness over the left trochanter  Midline lower back L5 L4 area tenderness left buttock tenderness  No weakness lower extremities  Skin:    General: Skin is warm and dry.     Capillary Refill: Capillary refill takes less than 2 seconds.  Neurological:     General: No focal deficit present.     Mental Status: She is alert and oriented to person, place, and time.  Psychiatric:        Mood and Affect: Mood normal.        Behavior: Behavior normal.        Thought Content: Thought content normal.        Judgment: Judgment normal.        Assessment:     Encounter Diagnoses  Name Primary?   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

## 2024-01-04 DIAGNOSIS — Z8639 Personal history of other endocrine, nutritional and metabolic disease: Secondary | ICD-10-CM | POA: Diagnosis not present

## 2024-01-04 DIAGNOSIS — Z13220 Encounter for screening for lipoid disorders: Secondary | ICD-10-CM | POA: Diagnosis not present

## 2024-01-04 DIAGNOSIS — R252 Cramp and spasm: Secondary | ICD-10-CM | POA: Diagnosis not present

## 2024-01-04 DIAGNOSIS — M5410 Radiculopathy, site unspecified: Secondary | ICD-10-CM | POA: Diagnosis not present

## 2024-01-04 DIAGNOSIS — Z23 Encounter for immunization: Secondary | ICD-10-CM | POA: Diagnosis not present

## 2024-01-04 DIAGNOSIS — Z0000 Encounter for general adult medical examination without abnormal findings: Secondary | ICD-10-CM | POA: Diagnosis not present

## 2024-01-04 DIAGNOSIS — I10 Essential (primary) hypertension: Secondary | ICD-10-CM | POA: Diagnosis not present

## 2024-02-15 ENCOUNTER — Ambulatory Visit
Admission: EM | Admit: 2024-02-15 | Discharge: 2024-02-15 | Disposition: A | Discharge status: Home / Self Care | Attending: Nurse Practitioner | Admitting: Nurse Practitioner

## 2024-02-15 DIAGNOSIS — R509 Fever, unspecified: Secondary | ICD-10-CM | POA: Diagnosis present

## 2024-02-15 DIAGNOSIS — J029 Acute pharyngitis, unspecified: Secondary | ICD-10-CM

## 2024-02-15 LAB — POCT RAPID STREP A (OFFICE): Rapid Strep A Screen: NEGATIVE

## 2024-02-15 MED ORDER — CLINDAMYCIN HCL 300 MG PO CAPS: 300.0000 mg | ORAL_CAPSULE | Freq: Three times a day (TID) | ORAL | 0 refills | Status: AC

## 2024-02-15 NOTE — ED Triage Notes (Signed)
 Pt presents to UC for c/o sore throat, fever, white spots in throat x2 days. Pt has taken tylenol for symptoms.

## 2024-02-15 NOTE — Discharge Instructions (Addendum)
 A throat culture has been ordered.  You will be contacted if the results of the culture are positive.  You will also be contacted if the results of the culture are negative negative and informed to stop the medication prescribed today. Take medication as prescribed. May take over-the-counter Tylenol as needed for pain, fever, or general discomfort. Warm salt water gargles 3-4 times daily as needed for throat pain or discomfort.  Also recommend Chloraseptic throat spray and throat lozenges while symptoms persist. Recommend a soft diet to include soup, broth, yogurt, pudding, or Jell-O as needed while throat pain persist. If symptoms fail to improve with this treatment, you may follow-up with your primary care physician for further evaluation. Follow-up as needed.

## 2024-02-15 NOTE — ED Provider Notes (Signed)
 [facility]    CSN: [id number] Arrival date & time: 02/15/24  0805      History   Chief Complaint No chief complaint on file.   HPI Sue Gutierrez is a 68 y.o. female.   The history is provided by the patient.   Patient presents for complaints of sore throat, fever, and white patches on her tonsils for the past 2 days.  States she did have a fever around 100 this morning.  Denies headache, ear pain, nasal congestion, runny nose, cough, abdominal pain, nausea, vomiting, diarrhea, or rash.  Patient denies any obvious known sick contacts.  States she has been taking Tylenol for her symptoms.  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   Melanoma in situ (HCC) 2004   Left leg   Palpitations 2012   Event recorder in 09/2011-multiple spells with palpitations and dyspnea, mostly with sinus rhythm but some with PACs or PVCs; normal TSH of 3.1 in 07/2011   Salivary gland tumor     Patient Active Problem List   Diagnosis Date Noted   Primary hypertension 08/27/2023   GERD (gastroesophageal reflux disease) 07/16/2023   Laryngopharyngeal reflux (LPR) 07/16/2023   Dysphagia 07/16/2023   Trigger point of shoulder region 04/28/2014   Trigger point of left shoulder region 03/17/2014   Trigger thumb 02/18/2014   Laboratory test 11/23/2011   Degenerative joint disease    Melanoma in situ [facility])    Palpitations     Past Surgical History:  Procedure Laterality Date   CARPAL TUNNEL RELEASE Right    COLONOSCOPY WITH PROPOFOL  N/A 09/14/2020   Procedure: COLONOSCOPY WITH PROPOFOL ;  Surgeon: Urban Garden, MD;  Location: [facility];  Service: Gastroenterology;  Laterality: N/A;  10:45   MELANOMA EXCISION     salivary gland tumor      OB History     Gravida  2   Para  2   Term      Preterm      AB      Living  2      SAB      IAB      Ectopic       Multiple      Live Births               Home Medications    Prior to Admission medications   Medication Sig Start Date End Date Taking? Authorizing Provider  amLODipine (NORVASC) 5 MG tablet Take 1 tablet by mouth daily. 11/23/23  Yes [provider]  carvedilol (COREG) 3.125 MG tablet Take 3.125 mg by mouth 2 (two) times daily. 08/07/23  Yes [provider]  magnesium 30 MG tablet Take 30 mg by mouth 2 (two) times daily.   Yes [provider]  omeprazole  (PRILOSEC) 20 MG capsule Take 1 capsule (20 mg total) by mouth daily before breakfast. 07/16/23  Yes Lanney Pitts, PA-C  VITAMIN D PO Take by mouth.   Yes [provider]  cycloSPORINE (RESTASIS) 0.05 % ophthalmic emulsion  05/04/21   [provider]  famotidine (PEPCID) 40 MG tablet Take 40 mg by mouth at bedtime.    [provider]  fluticasone  (FLONASE ) 50 MCG/ACT nasal spray Place 2 sprays into both nostrils daily. 06/24/23   Leath-Warren, Belen Bowers, NP  naproxen sodium (ALEVE) 220 MG tablet Take 220 mg  by mouth 2 (two) times daily as needed (pain.).    [provider]  PREMARIN  vaginal cream INSERT ONE GRAM INTO THE VAGINA ONCE DAILY 03/14/22   Wendelyn Halter, MD  rosuvastatin (CRESTOR) 5 MG tablet Take 5 mg by mouth at bedtime. 03/15/22   [provider]  valACYclovir  (VALTREX ) 1000 MG tablet Take 1 tablet (1,000 mg total) by mouth 2 (two) times daily. 08/30/23   Wendelyn Halter, MD  valACYclovir  (VALTREX ) 500 MG tablet Take 1 tablet (500 mg total) by mouth daily. 08/30/23   Wendelyn Halter, MD    Family History Family History  Problem Relation Age of Onset   Stroke Father    Cancer Brother    Colon cancer Neg Hx    Esophageal cancer Neg Hx    Stomach cancer Neg Hx     Social History Social History   Tobacco Use   Smoking status: Former   Smokeless tobacco: Never  Vaping Use   Vaping status: Never Used  Substance Use Topics   Alcohol use: Yes     Alcohol/week: 1.0 standard drink of alcohol    Types: 1 Standard drinks or equivalent per week    Comment: occasionally   Drug use: No     Allergies   Erythromycin, Penicillins, Septra [bactrim], and Sulfamethoxazole-trimethoprim    Review of Systems Review of Systems Per HPI  Physical Exam Triage Vital Signs ED Triage Vitals  Encounter Vitals Group     BP 02/15/24 0822 117/76     Systolic BP Percentile --      Diastolic BP Percentile --      Pulse Rate 02/15/24 0822 71     Resp 02/15/24 0822 16     Temp 02/15/24 0822 98.8 F (37.1 C)     Temp Source 02/15/24 0822 Oral     SpO2 02/15/24 0822 95 %     Weight --      Height --      Head Circumference --      Peak Flow --      Pain Score 02/15/24 0818 5     Pain Loc --      Pain Education --      Exclude from Growth Chart --    No data found.  Updated Vital Signs BP 117/76 (BP Location: Right Arm)   Pulse 71   Temp 98.8 F (37.1 C) (Oral)   Resp 16   SpO2 95%   Visual Acuity Right Eye Distance:   Left Eye Distance:   Bilateral Distance:    Right Eye Near:   Left Eye Near:    Bilateral Near:     Physical Exam Vitals and nursing note reviewed.  Constitutional:      Appearance: Normal appearance.  HENT:     Head: Normocephalic.     Right Ear: Tympanic membrane, ear canal and external ear normal.     Left Ear: Tympanic membrane, ear canal and external ear normal.     Nose: Nose normal.     Mouth/Throat:     Lips: Pink.     Mouth: Mucous membranes are moist.     Pharynx: Uvula midline. Pharyngeal swelling, oropharyngeal exudate, posterior oropharyngeal erythema and postnasal drip present. No uvula swelling.     Tonsils: 1+ on the right. 1+ on the left.     Comments: Cobblestoning present to posterior oropharynx  Eyes:     Extraocular Movements: Extraocular movements intact.     Conjunctiva/sclera: Conjunctivae normal.  Pupils: Pupils are equal, round, and reactive to light.  Cardiovascular:      Rate and Rhythm: Normal rate and regular rhythm.     Pulses: Normal pulses.     Heart sounds: Normal heart sounds.  Pulmonary:     Effort: Pulmonary effort is normal. No respiratory distress.     Breath sounds: Normal breath sounds. No stridor. No wheezing, rhonchi or rales.  Abdominal:     General: Bowel sounds are normal.     Palpations: Abdomen is soft.     Tenderness: There is no abdominal tenderness.  Musculoskeletal:     Cervical back: Normal range of motion.  Lymphadenopathy:     Cervical: No cervical adenopathy.  Skin:    General: Skin is warm and dry.  Neurological:     General: No focal deficit present.     Mental Status: She is alert and oriented to person, place, and time.  Psychiatric:        Mood and Affect: Mood normal.        Behavior: Behavior normal.      UC Treatments / Results  Labs (all labs ordered are listed, but only abnormal results are displayed) Labs Reviewed  POCT RAPID STREP A (OFFICE) - Normal    EKG   Radiology No results found.  Procedures Procedures (including critical care time)  Medications Ordered in UC Medications - No data to display  Initial Impression / Assessment and Plan / UC Course  I have reviewed the triage vital signs and the nursing notes.  Pertinent labs & imaging results that were available during my care of the patient were reviewed by me and considered in my medical decision making (see chart for details).  On exam, lung sounds are clear throughout, room air sats at 95%.  Patient does have exudate noticed on exam.  She also has been febrile per her report.  The rapid strep test was negative.  Throat culture is pending.  In the interim, will cover for acute pharyngitis with possibility of strep with clindamycin 300 mg.  Supportive care recommendations were provided and discussed with the patient to include continuing over-the-counter analgesics, warm salt water gargles, and a soft diet.  Discussed indications with  patient regarding follow-up.  Patient was advised if the culture result is negative, she will need to stop the antibiotic prescribed today.  Patient was in agreement with this plan of care and verbalizes understanding.  All questions were answered.  Patient stable for discharge.  Final Clinical Impressions(s) / UC Diagnoses   Final diagnoses:  None   Discharge Instructions   None    ED Prescriptions   None    PDMP not reviewed this encounter.   Hardy Lia, NP 02/15/24 [phone]

## 2024-02-18 LAB — CULTURE, GROUP A STREP (THRC)

## 2024-03-10 ENCOUNTER — Encounter: Payer: Self-pay | Admitting: Obstetrics & Gynecology

## 2024-03-11 ENCOUNTER — Other Ambulatory Visit: Payer: Self-pay | Admitting: Obstetrics & Gynecology

## 2024-03-11 DIAGNOSIS — N952 Postmenopausal atrophic vaginitis: Secondary | ICD-10-CM

## 2024-03-11 MED ORDER — PREMARIN 0.625 MG/GM VA CREA: TOPICAL_CREAM | VAGINAL | 6 refills | Status: AC

## 2024-03-11 NOTE — Progress Notes (Signed)
Rx for premarin vaginal cream

## 2024-04-25 ENCOUNTER — Encounter: Payer: Self-pay | Admitting: Orthopedic Surgery

## 2024-07-08 ENCOUNTER — Encounter: Payer: Self-pay | Admitting: Emergency Medicine

## 2024-07-08 ENCOUNTER — Ambulatory Visit
Admission: EM | Admit: 2024-07-08 | Discharge: 2024-07-08 | Disposition: A | Discharge status: Home / Self Care | Attending: Family Medicine | Admitting: Family Medicine

## 2024-07-08 DIAGNOSIS — R590 Localized enlarged lymph nodes: Secondary | ICD-10-CM | POA: Insufficient documentation

## 2024-07-08 DIAGNOSIS — J029 Acute pharyngitis, unspecified: Secondary | ICD-10-CM | POA: Insufficient documentation

## 2024-07-08 DIAGNOSIS — R6889 Other general symptoms and signs: Secondary | ICD-10-CM | POA: Insufficient documentation

## 2024-07-08 LAB — POCT RAPID STREP A (OFFICE): Rapid Strep A Screen: NEGATIVE

## 2024-07-08 LAB — POC COVID19/FLU A&B COMBO
Covid Antigen, POC: NEGATIVE
Influenza A Antigen, POC: NEGATIVE
Influenza B Antigen, POC: NEGATIVE

## 2024-07-08 MED ORDER — OSELTAMIVIR PHOSPHATE 75 MG PO CAPS: 75.0000 mg | ORAL_CAPSULE | Freq: Two times a day (BID) | ORAL | 0 refills | Status: AC

## 2024-07-08 NOTE — ED Provider Notes (Signed)
 [facility]    CSN: [id number] Arrival date & time: 07/08/24  1548      History   Chief Complaint No chief complaint on file.   HPI Sue Gutierrez is a 68 y.o. female.   Patient presents today with 1 day of fever, Tmax 101 degrees, body aches and chills, postnasal drainage and sore throat, Headache, nausea, decreased appetite, and fatigue.  She denies significant cough, shortness of breath or chest pain, near-syncope, ear pain, abdominal pain, vomiting, and diarrhea.  She was at a child's birthday party and reports was exposed to a child with pneumonia and strep throat.  Has been taking Tylenol for fever with mild temporary improvement.    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   Melanoma in situ (HCC) 2004   Left leg   Palpitations 2012   Event recorder in 09/2011-multiple spells with palpitations and dyspnea, mostly with sinus rhythm but some with PACs or PVCs; normal TSH of 3.1 in 07/2011   Salivary gland tumor     Patient Active Problem List   Diagnosis Date Noted   Primary hypertension 08/27/2023   GERD (gastroesophageal reflux disease) 07/16/2023   Laryngopharyngeal reflux (LPR) 07/16/2023   Dysphagia 07/16/2023   Trigger point of shoulder region 04/28/2014   Trigger point of left shoulder region 03/17/2014   Trigger thumb 02/18/2014   Laboratory test 11/23/2011   Degenerative joint disease    Melanoma in situ [facility])    Palpitations     Past Surgical History:  Procedure Laterality Date   CARPAL TUNNEL RELEASE Right    COLONOSCOPY WITH PROPOFOL  N/A 09/14/2020   Procedure: COLONOSCOPY WITH PROPOFOL ;  Surgeon: Eartha Angelia Sieving, MD;  Location: [facility];  Service: Gastroenterology;  Laterality: N/A;  10:45   MELANOMA EXCISION     salivary gland tumor      OB History     Gravida  2   Para  2   Term      Preterm      AB      Living   2      SAB      IAB      Ectopic      Multiple      Live Births               Home Medications    Prior to Admission medications   Medication Sig Start Date End Date Taking? Authorizing Provider  oseltamivir  (TAMIFLU ) 75 MG capsule Take 1 capsule (75 mg total) by mouth every 12 (twelve) hours for 5 days. 07/08/24 07/13/24 Yes Chandra Raisin A, NP  amLODipine (NORVASC) 5 MG tablet Take 1 tablet by mouth daily. 11/23/23   [provider]  carvedilol (COREG) 3.125 MG tablet Take 3.125 mg by mouth 2 (two) times daily. 08/07/23   [provider]  conjugated estrogens  (PREMARIN ) vaginal cream INSERT ONE GRAM INTO THE VAGINA ONCE DAILY 03/11/24   Ozan, Jennifer, DO  cycloSPORINE (RESTASIS) 0.05 % ophthalmic emulsion  05/04/21   [provider]  famotidine (PEPCID) 40 MG tablet Take 40 mg by mouth at bedtime.    [provider]  magnesium 30 MG tablet Take 30 mg by mouth 2 (two) times daily.    [provider]  naproxen sodium (ALEVE) 220 MG tablet Take 220 mg by mouth 2 (two) times daily  as needed (pain.).    [provider]  omeprazole  (PRILOSEC) 20 MG capsule Take 1 capsule (20 mg total) by mouth daily before breakfast. 07/16/23   Ezzard Sonny RAMAN, PA-C  rosuvastatin (CRESTOR) 5 MG tablet Take 5 mg by mouth at bedtime. 03/15/22   [provider]  valACYclovir  (VALTREX ) 1000 MG tablet Take 1 tablet (1,000 mg total) by mouth 2 (two) times daily. 08/30/23   Jayne Vonn DEL, MD  VITAMIN D PO Take by mouth.    [provider]    Family History Family History  Problem Relation Age of Onset   Stroke Father    Cancer Brother    Colon cancer Neg Hx    Esophageal cancer Neg Hx    Stomach cancer Neg Hx     Social History Social History   Tobacco Use   Smoking status: Former   Smokeless tobacco: Never  Vaping Use   Vaping status: Never Used  Substance Use Topics   Alcohol use: Yes    Alcohol/week: 1.0 standard  drink of alcohol    Types: 1 Standard drinks or equivalent per week    Comment: occasionally   Drug use: No     Allergies   Erythromycin, Penicillins, Septra [bactrim], and Sulfamethoxazole-trimethoprim    Review of Systems Review of Systems Per HPI  Physical Exam Triage Vital Signs ED Triage Vitals  Encounter Vitals Group     BP 07/08/24 1605 (!) 145/82     Girls Systolic BP Percentile --      Girls Diastolic BP Percentile --      Boys Systolic BP Percentile --      Boys Diastolic BP Percentile --      Pulse Rate 07/08/24 1605 85     Resp 07/08/24 1605 18     Temp 07/08/24 1605 99.1 F (37.3 C)     Temp Source 07/08/24 1605 Oral     SpO2 07/08/24 1605 96 %     Weight --      Height --      Head Circumference --      Peak Flow --      Pain Score 07/08/24 1606 7     Pain Loc --      Pain Education --      Exclude from Growth Chart --    No data found.  Updated Vital Signs BP (!) 145/82 (BP Location: Left Arm)   Pulse 85   Temp 99.1 F (37.3 C) (Oral)   Resp 18   SpO2 96%   Visual Acuity Right Eye Distance:   Left Eye Distance:   Bilateral Distance:    Right Eye Near:   Left Eye Near:    Bilateral Near:     Physical Exam Vitals and nursing note reviewed.  Constitutional:      General: She is not in acute distress.    Appearance: Normal appearance. She is ill-appearing. She is not toxic-appearing or diaphoretic.  HENT:     Head: Normocephalic and atraumatic.     Right Ear: Tympanic membrane, ear canal and external ear normal.     Left Ear: Tympanic membrane, ear canal and external ear normal.     Nose: No congestion or rhinorrhea.     Mouth/Throat:     Mouth: Mucous membranes are moist.     Pharynx: Oropharynx is clear. Posterior oropharyngeal erythema and postnasal drip present. No oropharyngeal exudate.  Eyes:     General: No scleral icterus.  Extraocular Movements: Extraocular movements intact.  Cardiovascular:     Rate and Rhythm: Normal  rate and regular rhythm.  Pulmonary:     Effort: Pulmonary effort is normal. No respiratory distress.     Breath sounds: Normal breath sounds. No wheezing, rhonchi or rales.  Musculoskeletal:     Cervical back: Normal range of motion and neck supple.  Lymphadenopathy:     Cervical: Cervical adenopathy present.  Skin:    General: Skin is warm and dry.     Coloration: Skin is not jaundiced or pale.     Findings: No erythema or rash.  Neurological:     Mental Status: She is alert and oriented to person, place, and time.  Psychiatric:        Behavior: Behavior is cooperative.      UC Treatments / Results  Labs (all labs ordered are listed, but only abnormal results are displayed) Labs Reviewed  POC COVID19/FLU A&B COMBO  POCT RAPID STREP A (OFFICE)    EKG   Radiology No results found.  Procedures Procedures (including critical care time)  Medications Ordered in UC Medications - No data to display  Initial Impression / Assessment and Plan / UC Course  I have reviewed the triage vital signs and the nursing notes.  Pertinent labs & imaging results that were available during my care of the patient were reviewed by me and considered in my medical decision making (see chart for details).   Patient is well-appearing, normotensive, afebrile, not tachycardic, not tachypneic, oxygenating well on room air.   1. Flu-like symptoms 2. Acute pharyngitis, unspecified etiology 3. Cervical lymphadenopathy Vitals and exam are reassuring COVID-19, influenza, and rapid strep throat test is negative Symptoms are consistent with influenza, test with Tamiflu  Other supportive measures discussed ER and return precautions discussed   The patient was given the opportunity to ask questions.  All questions answered to their satisfaction.  The patient is in agreement to this plan.   Final Clinical Impressions(s) / UC Diagnoses   Final diagnoses:  Flu-like symptoms  Acute pharyngitis,  unspecified etiology  Cervical lymphadenopathy     Discharge Instructions      Your symptoms are consistent with influenza.  COVID-19, influenza, and rapid strep throat tests are negative.  We are treating you for influenza with Tamiflu  today since your symptoms are consistent with influenza.  Some things that can make you feel better are: - Increased rest - Increasing fluid with water/sugar free electrolytes - Acetaminophen as needed for fever/pain - Salt water gargling, chloraseptic spray and throat lozenges - OTC guaifenesin (Mucinex) 600 mg twice daily if congestion develops - Saline sinus flushes or a neti pot - Humidifying the air     ED Prescriptions     Medication Sig Dispense Auth. Provider   oseltamivir  (TAMIFLU ) 75 MG capsule Take 1 capsule (75 mg total) by mouth every 12 (twelve) hours for 5 days. 10 capsule Chandra Harlene LABOR, NP      PDMP not reviewed this encounter.   Chandra Harlene LABOR, NP 07/08/24 [phone]

## 2024-07-08 NOTE — ED Triage Notes (Signed)
 Sore throat, fever, body aches that started today.

## 2024-07-08 NOTE — Discharge Instructions (Addendum)
 Your symptoms are consistent with influenza.  COVID-19, influenza, and rapid strep throat tests are negative.  We are treating you for influenza with Tamiflu  today since your symptoms are consistent with influenza.  Some things that can make you feel better are: - Increased rest - Increasing fluid with water/sugar free electrolytes - Acetaminophen as needed for fever/pain - Salt water gargling, chloraseptic spray and throat lozenges - OTC guaifenesin (Mucinex) 600 mg twice daily if congestion develops - Saline sinus flushes or a neti pot - Humidifying the air

## 2024-07-11 ENCOUNTER — Ambulatory Visit (HOSPITAL_COMMUNITY): Payer: Self-pay

## 2024-07-11 LAB — CULTURE, GROUP A STREP (THRC)

## 2024-09-10 ENCOUNTER — Other Ambulatory Visit: Payer: Self-pay | Admitting: Obstetrics & Gynecology

## 2024-09-22 ENCOUNTER — Other Ambulatory Visit: Payer: Self-pay | Admitting: Gastroenterology
